# Patient Record
Sex: Female | Born: 1965
Health system: Southern US, Community
[De-identification: ages and names within clinical notes are randomized; demographics above are authoritative.]

## PROBLEM LIST (undated history)

## (undated) DIAGNOSIS — M1990 Unspecified osteoarthritis, unspecified site: Secondary | ICD-10-CM

## (undated) DIAGNOSIS — Z9289 Personal history of other medical treatment: Secondary | ICD-10-CM

## (undated) DIAGNOSIS — D649 Anemia, unspecified: Secondary | ICD-10-CM

## (undated) DIAGNOSIS — T7840XA Allergy, unspecified, initial encounter: Secondary | ICD-10-CM

## (undated) DIAGNOSIS — E039 Hypothyroidism, unspecified: Secondary | ICD-10-CM

## (undated) DIAGNOSIS — R51 Headache: Secondary | ICD-10-CM

## (undated) DIAGNOSIS — E079 Disorder of thyroid, unspecified: Secondary | ICD-10-CM

## (undated) HISTORY — PX: JOINT REPLACEMENT: SHX530

## (undated) HISTORY — PX: TUBAL LIGATION: SHX77

## (undated) HISTORY — DX: Disorder of thyroid, unspecified: E07.9

## (undated) HISTORY — DX: Allergy, unspecified, initial encounter: T78.40XA

## (undated) HISTORY — PX: ENDOMETRIAL ABLATION: SHX621

---

## 1998-02-24 ENCOUNTER — Other Ambulatory Visit: Admission: RE | Admit: 1998-02-24 | Discharge: 1998-02-24 | Payer: Self-pay | Admitting: Obstetrics and Gynecology

## 1999-02-03 ENCOUNTER — Emergency Department (HOSPITAL_COMMUNITY): Admission: EM | Admit: 1999-02-03 | Discharge: 1999-02-03 | Payer: Self-pay | Admitting: *Deleted

## 1999-11-04 ENCOUNTER — Emergency Department (HOSPITAL_COMMUNITY): Admission: EM | Admit: 1999-11-04 | Discharge: 1999-11-04 | Payer: Self-pay | Admitting: Emergency Medicine

## 1999-11-04 ENCOUNTER — Encounter: Payer: Self-pay | Admitting: Emergency Medicine

## 2000-06-13 ENCOUNTER — Emergency Department (HOSPITAL_COMMUNITY): Admission: EM | Admit: 2000-06-13 | Discharge: 2000-06-14 | Payer: Self-pay | Admitting: *Deleted

## 2000-07-13 ENCOUNTER — Emergency Department (HOSPITAL_COMMUNITY): Admission: EM | Admit: 2000-07-13 | Discharge: 2000-07-13 | Payer: Self-pay | Admitting: Emergency Medicine

## 2000-08-10 ENCOUNTER — Ambulatory Visit (HOSPITAL_COMMUNITY): Admission: RE | Admit: 2000-08-10 | Discharge: 2000-08-10 | Payer: Self-pay | Admitting: Family Medicine

## 2000-08-10 ENCOUNTER — Encounter: Payer: Self-pay | Admitting: Family Medicine

## 2000-10-23 ENCOUNTER — Emergency Department (HOSPITAL_COMMUNITY): Admission: EM | Admit: 2000-10-23 | Discharge: 2000-10-23 | Payer: Self-pay | Admitting: *Deleted

## 2001-02-26 ENCOUNTER — Emergency Department (HOSPITAL_COMMUNITY): Admission: EM | Admit: 2001-02-26 | Discharge: 2001-02-26 | Payer: Self-pay | Admitting: *Deleted

## 2001-10-04 ENCOUNTER — Ambulatory Visit (HOSPITAL_COMMUNITY): Admission: RE | Admit: 2001-10-04 | Discharge: 2001-10-04 | Payer: Self-pay | Admitting: Family Medicine

## 2001-10-04 ENCOUNTER — Encounter: Payer: Self-pay | Admitting: Family Medicine

## 2001-10-09 ENCOUNTER — Emergency Department (HOSPITAL_COMMUNITY): Admission: EM | Admit: 2001-10-09 | Discharge: 2001-10-09 | Payer: Self-pay | Admitting: Emergency Medicine

## 2002-01-22 ENCOUNTER — Emergency Department (HOSPITAL_COMMUNITY): Admission: EM | Admit: 2002-01-22 | Discharge: 2002-01-22 | Payer: Self-pay | Admitting: Emergency Medicine

## 2002-02-07 ENCOUNTER — Emergency Department (HOSPITAL_COMMUNITY): Admission: EM | Admit: 2002-02-07 | Discharge: 2002-02-07 | Payer: Self-pay | Admitting: *Deleted

## 2002-10-17 ENCOUNTER — Encounter: Admission: RE | Admit: 2002-10-17 | Discharge: 2002-10-17 | Payer: Self-pay | Admitting: Family Medicine

## 2002-10-17 ENCOUNTER — Encounter: Payer: Self-pay | Admitting: Family Medicine

## 2002-10-21 ENCOUNTER — Encounter: Payer: Self-pay | Admitting: Internal Medicine

## 2002-10-21 ENCOUNTER — Ambulatory Visit (HOSPITAL_COMMUNITY): Admission: RE | Admit: 2002-10-21 | Discharge: 2002-10-21 | Payer: Self-pay | Admitting: Internal Medicine

## 2002-11-07 ENCOUNTER — Encounter: Payer: Self-pay | Admitting: Internal Medicine

## 2002-11-07 ENCOUNTER — Ambulatory Visit (HOSPITAL_COMMUNITY): Admission: RE | Admit: 2002-11-07 | Discharge: 2002-11-07 | Payer: Self-pay | Admitting: Internal Medicine

## 2002-11-19 ENCOUNTER — Emergency Department (HOSPITAL_COMMUNITY): Admission: EM | Admit: 2002-11-19 | Discharge: 2002-11-19 | Payer: Self-pay | Admitting: Emergency Medicine

## 2003-01-03 ENCOUNTER — Emergency Department (HOSPITAL_COMMUNITY): Admission: EM | Admit: 2003-01-03 | Discharge: 2003-01-03 | Payer: Self-pay | Admitting: Emergency Medicine

## 2003-11-18 ENCOUNTER — Ambulatory Visit (HOSPITAL_COMMUNITY): Admission: RE | Admit: 2003-11-18 | Discharge: 2003-11-18 | Payer: Self-pay | Admitting: Family Medicine

## 2004-01-16 ENCOUNTER — Ambulatory Visit (HOSPITAL_COMMUNITY): Admission: RE | Admit: 2004-01-16 | Discharge: 2004-01-16 | Payer: Self-pay | Admitting: Family Medicine

## 2004-02-09 ENCOUNTER — Emergency Department (HOSPITAL_COMMUNITY): Admission: EM | Admit: 2004-02-09 | Discharge: 2004-02-09 | Payer: Self-pay | Admitting: Emergency Medicine

## 2004-06-27 DIAGNOSIS — D649 Anemia, unspecified: Secondary | ICD-10-CM

## 2004-06-27 HISTORY — DX: Anemia, unspecified: D64.9

## 2004-06-29 ENCOUNTER — Emergency Department (HOSPITAL_COMMUNITY): Admission: EM | Admit: 2004-06-29 | Discharge: 2004-06-29 | Payer: Self-pay | Admitting: Emergency Medicine

## 2004-11-18 ENCOUNTER — Encounter: Admission: RE | Admit: 2004-11-18 | Discharge: 2004-11-18 | Payer: Self-pay | Admitting: Family Medicine

## 2005-02-07 ENCOUNTER — Encounter: Admission: RE | Admit: 2005-02-07 | Discharge: 2005-02-07 | Payer: Self-pay | Admitting: Family Medicine

## 2005-04-24 ENCOUNTER — Encounter: Admission: RE | Admit: 2005-04-24 | Discharge: 2005-04-24 | Payer: Self-pay | Admitting: Neurology

## 2005-05-12 ENCOUNTER — Inpatient Hospital Stay (HOSPITAL_COMMUNITY): Admission: RE | Admit: 2005-05-12 | Discharge: 2005-05-17 | Payer: Self-pay | Admitting: Orthopedic Surgery

## 2006-03-30 ENCOUNTER — Ambulatory Visit (HOSPITAL_COMMUNITY): Admission: RE | Admit: 2006-03-30 | Discharge: 2006-03-30 | Payer: Self-pay | Admitting: *Deleted

## 2007-11-15 ENCOUNTER — Emergency Department (HOSPITAL_COMMUNITY): Admission: EM | Admit: 2007-11-15 | Discharge: 2007-11-15 | Payer: Self-pay | Admitting: Emergency Medicine

## 2008-04-08 ENCOUNTER — Encounter: Admission: RE | Admit: 2008-04-08 | Discharge: 2008-04-08 | Payer: Self-pay | Admitting: Family Medicine

## 2009-04-28 ENCOUNTER — Encounter: Admission: RE | Admit: 2009-04-28 | Discharge: 2009-04-28 | Payer: Self-pay | Admitting: Family Medicine

## 2010-04-29 ENCOUNTER — Encounter: Admission: RE | Admit: 2010-04-29 | Discharge: 2010-04-29 | Payer: Self-pay | Admitting: Family Medicine

## 2010-11-12 NOTE — Op Note (Signed)
NAMEBRITTAINY, BUCKER              ACCOUNT NO.:  [id number]   MEDICAL RECORD NO.:  [id number]          PATIENT TYPE:  INP   LOCATION:  0010                         FACILITY:  [facility]   PHYSICIAN:  Georges Lynch. Gioffre, M.D.DATE OF BIRTH:  [birth year]   DATE OF PROCEDURE:  05/12/2005  DATE OF DISCHARGE:                                 OPERATIVE REPORT   SURGEON:  Dr. Darrelyn Hillock   ASSISTANT:  Dr. Durene Romans.   PREOPERATIVE DIAGNOSIS:  Severe degenerative arthritis, left hip.   POSTOPERATIVE DIAGNOSIS:  Severe degenerative arthritis, left hip.   OPERATION:  Left total hip arthroplasty utilizing the DePuy system.  The  sizes used:  1.  First of all, we used a 52 mm pinnacle cuff with 1 screw.  2.  We used a pinnacle metal insert measuring 36 mm in diameter by 52 mm.  3.  The femoral head, metal on metal, was a +5.  We used the standard      femoral stem which was a size 3 standard offset.   PROCEDURE:  Under general anesthesia, a routine orthopedic prep and draping  of the left hip was carried out.  The patient was on the right side with the  left side up.  The patient had 1 gram of IV Ancef.  Following this, an  incision was made over the posterolateral aspect of the left hip, bleeders  identified and cauterized.  At this particular point, we then identified the  iliotibial band, incised that, and great care was taken not to injure the  sciatic nerve.  We advanced our self-retaining retractors.  I went down and  identified the capsule and the external rotators, and I partially detached  the external rotators and did a capsulectomy, dislocated the head, amputated  the head at the appropriate neck length.  Following this, I used my  trochanteric reamers.  I then went down and rasped the femur out to a size  3.  I then used a calcar reamer to even out the femoral neck.  We then  reamed our cup up for a size 52 mm pinnacle cup.  The permanent cup then was  inserted with 1 screw.  We had  excellent fixation.  We also utilized our  center screw which was actually the hole eliminator.  Once the cuff was  inserted, we then inserted our final metal pinnacle cup insert which was 36  x 52 mm.  We then went through trials and selected a +5 metal-on-metal  femoral head.  The trial stem was removed.  We inserted our permanent size 3  femoral component and then snapped on our size +5 36 mm ball.  We irrigated  the acetabulum out and made sure there are no loose fragments of bone or  soft tissue in the acetabulum and reduced the hip.  The hip was extremely  stable in all ranges of motion.  We checked our leg length multiple times.  Our leg length was equal.  Thoroughly irrigated out the area and  reapproximated the soft tissue structures in the usual fashion.  Skin was  closed  metal staples, and a sterile Neosporin dressing was applied.  The  patient left the operating room in satisfactory condition.           ______________________________  Georges Lynch Darrelyn Hillock, M.D.     RAG/MEDQ  D:  05/12/2005  T:  05/12/2005  Job:  [id number]

## 2010-11-12 NOTE — Op Note (Signed)
NAMEDOREATHER, HOXWORTH              ACCOUNT NO.:  [id number]   MEDICAL RECORD NO.:  [id number]          PATIENT TYPE:  AMB   LOCATION:  SDC                           FACILITY:  [facility]   PHYSICIAN:  Dunbar B. Earlene Plater, M.D.  DATE OF BIRTH:  [birth year]   DATE OF PROCEDURE:  03/30/2006  DATE OF DISCHARGE:                                 OPERATIVE REPORT   PREOPERATIVE DIAGNOSIS:  Abnormal bleeding.   POSTOPERATIVE DIAGNOSIS:  Abnormal bleeding.   PROCEDURE:  Hysteroscopy and NovaSure endometrial ablation.   SURGEON:  Chester Holstein. Earlene Plater, M.D.   ASSISTANT:  None.   ANESTHESIA:  LMA general and 20 mL Nesacaine 1% paracervical block.   SPECIMENS:  None.   BLOOD LOSS:  Minimal.   FLUID DEFICIT:  20 mL.   COMPLICATIONS:  None.   INDICATIONS:  Patient with a history of heavy menstrual bleeding, desires  permanent but minimally invasive treatment for her heavy menstrual bleeding.  The patient was advised of the risks of surgery, including infection,  bleeding, uterine perforation, damage to surrounding organs.   PROCEDURE:  The patient was taken to the operating room and LMA general  anesthesia obtained.  She was prepped and draped in standard fashion,  bladder emptied with an in-and-out catheterization.  Examination showed an  anteverted, slightly enlarged uterus, no adnexal masses.   Speculum inserted, paracervical block placed, single-tooth attached.  Cervical length and uterine cavity depth estimated and recorded.  Cervix  dilated to #17 and the diagnostic hysteroscope inserted after being flushed  with uterine distention.  The cavity was inspected and no focal  abnormalities were seen.  The scope removed, NovaSure device inserted, and  the array deployed in standard fashion.  CO2 test performed and passed.  Endometrial ablation performed for 2 minutes.   The array was retracted, NovaSure removed, the scope reinserted, and good  endometrial coverage was noted.   The instruments were  removed, cervix hemostatic.  The patient tolerated the  procedure well with no complications.  She was taken to the recovery room  awake, alert, in stable condition.      Gerri Spore B. Earlene Plater, M.D.  Electronically Signed     WBD/MEDQ  D:  03/30/2006  T:  03/31/2006  Job:  [id number]

## 2010-11-12 NOTE — H&P (Signed)
[name], [name]              ACCOUNT NO.:  [id number]   MEDICAL RECORD NO.:  [id number]           PATIENT TYPE:   LOCATION:                                 FACILITY:   PHYSICIAN:  Georges Lynch. Gioffre, M.D.DATE OF BIRTH:  Sep 12, 1965   DATE OF ADMISSION:  05/12/2005  DATE OF DISCHARGE:                                HISTORY & PHYSICAL   CHIEF COMPLAINT:  Left hip pain and loss of range of motion.   HISTORY OF PRESENT ILLNESS:  The patient is a 45 year old black female who  came in this last week previous for evaluation of right knee pain, but she  also complained of right hip pain.  She has been having pain in that hip  over the last year.  She did have it evaluated previously by primary care  physician for loss of range of motion with an MRI which showed  osteoarthritis.  The patient was having groin pain and loss of range of  motion in that hip on the office visit.  The pain is a sharp stabbing type  pain over the anterolateral aspect of the hip.  She is no longer able to  lift and rotate her leg to the point where she is able to put her own shoes  and socks on. She has pain whenever she walks.  Does have some grating  sensation.  X-rays show severe osteoarthritis of the left hip with large  osteophyte formation and loss of joint space.   ALLERGIES:  No known drug allergies.   CURRENT MEDICATIONS:  Synthroid 150 mcg daily.   PAST MEDICAL HISTORY:  Includes hypothyroidism.   PAST SURGICAL HISTORY:  C section x2 with no complications.   SOCIAL HISTORY:  The patient is married.  She works as a Scientist, physiological. She  has 2 grown children.  She does not smoke or use alcohol.  She lives in a on-  story house.   Primary care physician is Dr. Jocelyn Lamer D. Reese.   FAMILY MEDICAL HISTORY:  Father is deceased with complications related to  cirrhosis of the liver and related cancer, also diabetes at the age of 31.  Mother is 89 with diabetes and arthritis.   REVIEW OF SYSTEMS:  Positive  for occasional migraines for which she uses  over-the-counter Excedrin Migraine.  Otherwise, all Review of Systems  categories are unremarkable.   PHYSICAL EXAMINATION:  VITAL SIGNS: Height 5 feet 5 inches, weigh 190  pounds. Temperature 98.3, blood pressure 112/78, pulse 80, respirations 12.  GENERAL:  This is a healthy-appearing, well-developed, 45 year old black  female. She does walk with a left-sided limp.  She easily gets on and off  the exam table. She appears to be in no extreme distress.  HEENT:  Head is normocephalic. Pupils equal, round, and reactive.  Extraocular movements intact  Gross hearing is intact.  Oral buccal mucosa  is pink and moist.  NECK: Supple.  No palpable lymphadenopathy.  Thyroid region was nontender.  She had good range of motion of her cervical spine without any difficulty or  tenderness.  CHEST: Lung sounds were clear and  equal bilaterally. No wheezes, rales,  rhonchi.  HEART: Regular rate and rhythm. No murmurs, rubs, or gallops.  ABDOMEN:  Soft, nontender.  Bowel sounds normoactive. No CVA region  tenderness.  UPPER EXTREMITIES:  Symmetric size and shape.  She had good range of motion  of shoulders, elbows, wrists.  Motor strength 5/5.  LOWER EXTREMITIES: Right hip had full extension and flexion up to 130  degrees with 20 degrees internal and external rotation without any  discomfort.  Left hip had full extension and flexion up to about 80 degrees.  She had virtually no internal or external rotation without any discomfort or  mechanical blocking.  Bilateral knees were symmetrical. She had full  extension, flexion back to about 110 degrees with no instability.  Calves  were nontender.  The ankles were symmetrical with good dorsiflexion and  plantar flexion.  PERIPHERAL VASCULAR:  Carotid pulses were 2+ with no bruits.  Radial pulses  were 2+.  Dorsalis pedis and posterior tibial pulses were 1+.  She had some  slight lower extremity edema but no venous  stasis changes.  NEUROLOGIC: The patient was conscious, alert, and appropriate, held easy  conversation with the examiner.  Cranial nerves II-XII were grossly intact.  She had no gross neurologic defects noted.  BREASTS, RECTAL, AND GU: Exams deferred at this time.   IMPRESSION:  1.  End-stage osteoarthritis left hip.  2.  Occasional migraines.  3.  Hypothyroidism.   PLAN:  The patient will undergo all routine labs and tests prior to having a  left total hip arthroplasty by Dr. Darrelyn Hillock at [facility] on  May 12, 2005.      Jamelle Rushing, P.A.    ______________________________  Georges Lynch Darrelyn Hillock, M.D.    RWK/MEDQ  D:  04/27/2005  T:  04/27/2005  Job:  [id number]

## 2010-11-12 NOTE — Discharge Summary (Signed)
NAMEJOLLY, CARLINI              ACCOUNT NO.:  [id number]   MEDICAL RECORD NO.:  [id number]          PATIENT TYPE:  INP   LOCATION:  1508                         FACILITY:  [facility]   PHYSICIAN:  Georges Lynch. Gioffre, M.D.DATE OF BIRTH:  [birth year]   DATE OF ADMISSION:  05/12/2005  DATE OF DISCHARGE:  05/17/2005                                 DISCHARGE SUMMARY   ADMISSION DIAGNOSES:  1.  End-stage osteoarthritis, left hip.  2.  Occasional migraines.  3.  Hypothyroidism.   DISCHARGE DIAGNOSES:  1.  Left total hip arthroplasty.  2.  Postoperative blood loss anemia tolerated without transfusion.  3.  History of occasional migraines.  4.  History of hypothyroidism.   HISTORY OF PRESENT ILLNESS:  The patient is a 45 year old black female who  presented the week previous for evaluation of right knee pain ____________  evaluation. She had severe end-stage osteoarthritis of the right hip. It has  progressively worsened over the last year. The pain was deep within the  groin. She had noted significant loss of range of motion. She describes the  pain as sharp and stabbing type pain with any intensive range of motion or  weightbearing.   X-rays show severe osteoarthritis of the left hip with large osteophytes and  complete loss of joint space.   ALLERGIES:  No drug allergies.   CURRENT MEDICATIONS:  Synthroid 150 mg daily._______   PROCEDURE:  On May 12, 2005, the patient was taken to the OR by Dr.  Ranee Gosselin, assisted by Dr. Royetta Car. Under general anesthesia, the  patient underwent a left total hip arthroplasty with metal on metal. The  following components were implanted. A size 3 tapered stem, a size 36 plus 5  metal on metal femoral head, a size __________ neutral 36 mm inside  diameter, 52 mm outside diameter insert. A size 52 mm outside diameter  acetabular cup ___________ apex hole eliminator and one cancellous screw  6.5, 30 mm length. The patient tolerated the  procedure well without any  complications and transferred to the recovery room and then to the  orthopedic floor in good condition.   CONSULTATIONS:  The following routine consults were requested:  Physical  therapy, case management, pharmacy for dosing.   HOSPITAL COURSE:  On May 12, 2005, the patient was admitted to [facility] under the care of Dr. Mickel Baas. The patient was taken to  the OR where a left total hip arthroplasty was performed. The patient  tolerated transitioning from IV pain and antibiotics to p.o. pain  medications well without any issues. She ___________ 5 days postoperative  care on the orthopedic floor following routine protocols. She was started on  Coumadin and heparin for DVT prophylaxis and was managed by pharmacy  throughout her stay. The patient's wound remained ___________ the leg  remained neuromotor vascularly intact. The patient did develop some  postoperative blood loss anemia. initially her hemoglobin dropped into the  mid 8 levels but after introducing IV hydration she improved to 9.4. She was  also typed, crossed and transfused 2 units of packed red blood  cells without  any untoward events. The patient otherwise remained stable on all other  issues. She worked well with physical therapy on a daily basis. She was felt  on postoperative day #5 to be orthopedically and medically stable and ready  for transfer home so she was discharged home with outpatient home health  physical therapy arranged and she was discharged in good condition.   LABORATORY DATA:  CBC, November 19 WBC is 13, __________ with no obvious  source of infection, felt to be postoperative stress. Hemoglobin 9.4 up from  8.0 the day previous. After 2 units of packed red blood cells __________,  platelets 280. INR on November 21 was 2.7, was very labile. Routine  chemistries November 19, sodium 134, potassium 3.3, glucose 107, BUN 4,  creatinine 0.9. The patient  received a total of 2 units of packed red blood  cells during the hospitalization.   DISCHARGE MEDICATIONS:  1.  Synthroid 125 mcg p.o. daily.  2.  Colace 100 mg twice a day.  3.  Ferrous sulfate 325 mg 3 times a day.  4.  Dulcolax 10 mg p.o. q.h.s. p.r.n.  5.  Phenergan 25 mg p.o. q.6h. p.r.n.  6.  Percocet 1 or 2 tablets every q.4-6h. p.r.n. pain.  7.  Robaxin 500 mg p.o. every q.6h. p.r.n.   ___________ not tolerating Percocet well, she may try Vicodin 5 mg 1 or 2  tablets every 4-6 h p.r.n. pain.   DISCHARGE INSTRUCTIONS:  1.  Diet - no restrictions.  2.  Activity- the patient will be weightbearing as tolerated with the use of      crutches or walker.  3.  Wound care - the patient should change dressing daily ___________.  4.  Medications - the patient should resume her routine meds with the      addition of:  5.  Percocet 1 or 2 tablets every 4-6 h for pain as needed.  6.  Coumadin 5 mg a day or as instructed by the pharmacist.  7.  Robaxin 500 mg every 6 h for muscle spasms as needed.   FOLLOW UP:  The patient is to followup with Dr. Darrelyn Hillock 2 weeks from the day  of surgery. The patient is to call for an appointment.   CONDITION ON DISCHARGE:  Improved, good.      Jamelle Rushing, P.A.    ______________________________  Georges Lynch Darrelyn Hillock, M.D.    RWK/MEDQ  D:  06/09/2005  T:  06/10/2005  Job:  [id number]

## 2011-04-28 ENCOUNTER — Other Ambulatory Visit: Payer: Self-pay | Admitting: Family Medicine

## 2011-04-28 DIAGNOSIS — Z1231 Encounter for screening mammogram for malignant neoplasm of breast: Secondary | ICD-10-CM

## 2011-05-04 ENCOUNTER — Ambulatory Visit
Admission: RE | Admit: 2011-05-04 | Discharge: 2011-05-04 | Disposition: A | Discharge status: Home / Self Care | Payer: 59 | Source: Ambulatory Visit | Attending: Family Medicine | Admitting: Family Medicine

## 2011-05-04 DIAGNOSIS — Z1231 Encounter for screening mammogram for malignant neoplasm of breast: Secondary | ICD-10-CM

## 2011-08-03 ENCOUNTER — Ambulatory Visit (INDEPENDENT_AMBULATORY_CARE_PROVIDER_SITE_OTHER): Payer: 59 | Admitting: Family Medicine

## 2011-08-03 VITALS — BP 111/74 | HR 78 | Temp 98.7°F | Resp 18 | Ht 66.0 in | Wt 206.0 lb

## 2011-08-03 DIAGNOSIS — E039 Hypothyroidism, unspecified: Secondary | ICD-10-CM | POA: Insufficient documentation

## 2011-08-03 DIAGNOSIS — R059 Cough, unspecified: Secondary | ICD-10-CM

## 2011-08-03 DIAGNOSIS — B9789 Other viral agents as the cause of diseases classified elsewhere: Secondary | ICD-10-CM

## 2011-08-03 DIAGNOSIS — R05 Cough: Secondary | ICD-10-CM

## 2011-08-03 DIAGNOSIS — J069 Acute upper respiratory infection, unspecified: Secondary | ICD-10-CM

## 2011-08-03 MED ORDER — BENZONATATE 100 MG PO CAPS: 100.0000 mg | ORAL_CAPSULE | Freq: Three times a day (TID) | ORAL | Status: AC | PRN

## 2011-08-03 NOTE — Patient Instructions (Signed)
Patient was told to stay off of work today. She can probably return tomorrow. She is to drink plenty of fluids and get enough rest. She can take for cough tablets. If she needs something in addition to them for the cough she is recommended to try some over-the-counter Delsym.  For the drainage from her nose and pressure in the sinuses she is to take either Claritin-D or Allegra D., both of which come in generic.  If symptoms persist for 5-7 days she is to let me know.

## 2011-08-03 NOTE — Progress Notes (Signed)
  Subjective:    Patient ID: [name], female    DOB: 04/05/1966, 46 y.o.   MRN: [id number]  HPI Patient awake awakened yesterday with a sore throat. She had a sore throat and then later in the day of life and especially this morning has had a lot of head congestion. Still having a sore throat though not as bad. She has pressure in her sinuses. She has not had any defined fever. It has gone into a cough some also. She attributes some of her symptoms to the postnasal drainage. Her coworkers at work her for many been ill also   Review of Systems no other major complaints.     Objective:   Physical Exam Afro-American female, looks like she does not feel well today. TMs normal tenderness over the maxillary sinuses. Throat does not appear particularly erythematous, in fact is a little pale and boggy looking. Neck supple without any significant nodes chest was clear to auscultation heart regular without murmur       Assessment & Plan:  Viral syndrome with pharyngitis, sinus congestion, and secondary cough.  Will treat symptomatically. If it persists for 5-7 days she is to let me know if she is not doing better thank you

## 2011-08-05 ENCOUNTER — Telehealth: Payer: Self-pay

## 2011-08-05 NOTE — Telephone Encounter (Signed)
.  [facility] PT WAS TO CALL BACK IF NO BETTER AND SHE ISN'T PLEASE CALL [phone]

## 2011-08-06 NOTE — Telephone Encounter (Signed)
Spoke with patient, she is still very congested, clear mucus, no fever, sinus pressure.  Would like to know next step?   She is taking the zyrtec 24 hr.  No improvement.

## 2011-08-07 ENCOUNTER — Telehealth: Payer: Self-pay

## 2011-08-07 MED ORDER — IPRATROPIUM BROMIDE 0.03 % NA SOLN: 2.0000 | Freq: Two times a day (BID) | NASAL | Status: DC

## 2011-08-07 NOTE — Telephone Encounter (Signed)
LMOM FOR PT TO C/B.

## 2011-08-07 NOTE — Telephone Encounter (Signed)
ADVISED PT OF NOTES FROM PROVIDER

## 2011-08-07 NOTE — Telephone Encounter (Signed)
Please advise patient that I've sent in a prescription for Atrovent nasal spray to help open up her sinuses and allow for better drainage.  Continue with rest, fluids, and Mucinex.

## 2011-08-07 NOTE — Telephone Encounter (Signed)
Patient is returning clinical team leader call from this morning.

## 2011-11-12 ENCOUNTER — Ambulatory Visit (INDEPENDENT_AMBULATORY_CARE_PROVIDER_SITE_OTHER): Payer: 59 | Admitting: Internal Medicine

## 2011-11-12 VITALS — BP 104/70 | HR 73 | Temp 98.4°F | Resp 16 | Ht 66.0 in | Wt 210.4 lb

## 2011-11-12 DIAGNOSIS — S239XXA Sprain of unspecified parts of thorax, initial encounter: Secondary | ICD-10-CM

## 2011-11-12 DIAGNOSIS — IMO0002 Reserved for concepts with insufficient information to code with codable children: Secondary | ICD-10-CM

## 2011-11-12 MED ORDER — MELOXICAM 15 MG PO TABS: 15.0000 mg | ORAL_TABLET | Freq: Every day | ORAL | Status: DC

## 2011-11-12 MED ORDER — CYCLOBENZAPRINE HCL 10 MG PO TABS: 10.0000 mg | ORAL_TABLET | Freq: Every day | ORAL | Status: AC
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
## 2012-04-04 ENCOUNTER — Encounter: Payer: Self-pay | Admitting: Cardiology

## 2012-04-12 ENCOUNTER — Other Ambulatory Visit: Payer: Self-pay | Admitting: Family Medicine

## 2012-04-12 DIAGNOSIS — Z1231 Encounter for screening mammogram for malignant neoplasm of breast: Secondary | ICD-10-CM

## 2012-05-07 ENCOUNTER — Ambulatory Visit: Payer: 59

## 2012-05-18 ENCOUNTER — Ambulatory Visit
Admission: RE | Admit: 2012-05-18 | Discharge: 2012-05-18 | Disposition: A | Discharge status: Home / Self Care | Payer: 59 | Source: Ambulatory Visit | Attending: Family Medicine | Admitting: Family Medicine

## 2012-05-18 DIAGNOSIS — Z1231 Encounter for screening mammogram for malignant neoplasm of breast: Secondary | ICD-10-CM

## 2012-07-10 ENCOUNTER — Ambulatory Visit (INDEPENDENT_AMBULATORY_CARE_PROVIDER_SITE_OTHER): Payer: 59 | Admitting: Physician Assistant

## 2012-07-10 VITALS — BP 104/71 | HR 84 | Temp 97.9°F | Resp 16 | Ht 66.5 in | Wt 224.0 lb

## 2012-07-10 DIAGNOSIS — J069 Acute upper respiratory infection, unspecified: Secondary | ICD-10-CM

## 2012-07-10 MED ORDER — IPRATROPIUM BROMIDE 0.03 % NA SOLN: 2.0000 | Freq: Two times a day (BID) | NASAL | Status: DC

## 2012-07-10 MED ORDER — BENZONATATE 100 MG PO CAPS: 100.0000 mg | ORAL_CAPSULE | Freq: Three times a day (TID) | ORAL | Status: DC | PRN

## 2012-07-10 NOTE — Progress Notes (Signed)
  Subjective:    Patient ID: [name], female    DOB: 09-22-1965, 47 y.o.   MRN: [id number]  HPI   [name] is a pleasant 47 yr old female here with URI symptoms for the last 4-5 days.  Began with cough, then progressed to sore throat, sinus pressure, runny nose.  Yellow nasal drainage.  Cough productive of yellow sputum.  No ear pain.  +post-nasal drainage.  Cough worse at night.  No fever or chills.  No GI symptoms.  No body aches.  States her 3 foster children have been sick over the last couple weeks.     Review of Systems  Constitutional: Negative for fever and chills.  HENT: Positive for congestion, sore throat, rhinorrhea, postnasal drip and sinus pressure. Negative for ear pain.   Respiratory: Positive for cough. Negative for shortness of breath and wheezing.   Cardiovascular: Negative.   Gastrointestinal: Negative.   Musculoskeletal: Negative.   Skin: Negative.   Neurological: Negative for headaches.       Objective:   Physical Exam  Vitals reviewed. Constitutional: She is oriented to person, place, and time. She appears well-developed and well-nourished. No distress.  HENT:  Head: Normocephalic and atraumatic.  Right Ear: Tympanic membrane and ear canal normal.  Left Ear: Tympanic membrane and ear canal normal.  Nose: Right sinus exhibits maxillary sinus tenderness. Right sinus exhibits no frontal sinus tenderness. Left sinus exhibits maxillary sinus tenderness (L > R). Left sinus exhibits no frontal sinus tenderness.  Mouth/Throat: Uvula is midline, oropharynx is clear and moist and mucous membranes are normal.       Post-nasal drainage  Eyes: Conjunctivae normal are normal. No scleral icterus.  Neck: Neck supple.  Cardiovascular: Normal rate, regular rhythm, normal heart sounds and intact distal pulses.  Exam reveals no gallop and no friction rub.   No murmur heard. Pulmonary/Chest: Effort normal and breath sounds normal. She has no wheezes. She has no rales.    Lymphadenopathy:    She has no cervical adenopathy.  Neurological: She is alert and oriented to person, place, and time.  Skin: Skin is warm and dry.  Psychiatric: She has a normal mood and affect. Her behavior is normal.     Filed Vitals:   07/10/12 0832  BP: 104/71  Pulse: 84  Temp: 97.9 F (36.6 C)  Resp: 16        Assessment & Plan:   1. Viral URI with cough  ipratropium (ATROVENT) 0.03 % nasal spray, benzonatate (TESSALON) 100 MG capsule    [name] is a very pleasant 47 yr old female here with URI symptoms, suspect viral etiology.  She is afebrile, well-appearing, and lungs and throat are clear.  Maxillary sinus tenderness present on exam (L>R) but given that symptoms have been present only 4-5 days, will hold off on antibiotic treatment at this point.  Will treat symptoms with Atrovent and Tessalon.  Encouraged Robitussin or Delsym for additional cough relief given pt intolerance to codeine/oxycodone.  Also encouraged OTC antihistamine for symptom relief.  Push fluids, plenty of rest. Pt will let us know if worsening or not improving.    If in 4-5 days pt continues to have facial pain/pressure, will treat as sinusitis with amox/clav x 10 days.

## 2012-07-10 NOTE — Patient Instructions (Addendum)
Begin using Atrovent nasal spray twice daily for relief of congestion and post-nasal drainage.  Also begin a daily allergy medication (Claritin/Allegra/Zyrtec) for further relief of symptoms.  You may continue Mucinex as well.  Tessalon for cough up to three times per day.  If you need stronger cough relief at night, I would try Robitussin or Delsym.  Plenty of fluids (water is best!) and rest!  Good hand hygiene.  Cover your mouth when coughing or sneezing.  Please let us know if you are worsening or not improving.  If you continue to have facial pain/pressure over the next 4-5 days, let us know and we can call in an antibiotic.   Upper Respiratory Infection, Adult An upper respiratory infection (URI) is also sometimes known as the common cold. The upper respiratory tract includes the nose, sinuses, throat, trachea, and bronchi. Bronchi are the airways leading to the lungs. Most people improve within 1 week, but symptoms can last up to 2 weeks. A residual cough may last even longer.  CAUSES Many different viruses can infect the tissues lining the upper respiratory tract. The tissues become irritated and inflamed and often become very moist. Mucus production is also common. A cold is contagious. You can easily spread the virus to others by oral contact. This includes kissing, sharing a glass, coughing, or sneezing. Touching your mouth or nose and then touching a surface, which is then touched by another person, can also spread the virus. SYMPTOMS  Symptoms typically develop 1 to 3 days after you come in contact with a cold virus. Symptoms vary from person to person. They may include:  Runny nose.  Sneezing.  Nasal congestion.  Sinus irritation.  Sore throat.  Loss of voice (laryngitis).  Cough.  Fatigue.  Muscle aches.  Loss of appetite.  Headache.  Low-grade fever. DIAGNOSIS  You might diagnose your own cold based on familiar symptoms, since most people get a cold 2 to 3 times a  year. Your caregiver can confirm this based on your exam. Most importantly, your caregiver can check that your symptoms are not due to another disease such as strep throat, sinusitis, pneumonia, asthma, or epiglottitis. Blood tests, throat tests, and X-rays are not necessary to diagnose a common cold, but they may sometimes be helpful in excluding other more serious diseases. Your caregiver will decide if any further tests are required. RISKS AND COMPLICATIONS  You may be at risk for a more severe case of the common cold if you smoke cigarettes, have chronic heart disease (such as heart failure) or lung disease (such as asthma), or if you have a weakened immune system. The very young and very old are also at risk for more serious infections. Bacterial sinusitis, middle ear infections, and bacterial pneumonia can complicate the common cold. The common cold can worsen asthma and chronic obstructive pulmonary disease (COPD). Sometimes, these complications can require emergency medical care and may be life-threatening. PREVENTION  The best way to protect against getting a cold is to practice good hygiene. Avoid oral or hand contact with people with cold symptoms. Wash your hands often if contact occurs. There is no clear evidence that vitamin C, vitamin E, echinacea, or exercise reduces the chance of developing a cold. However, it is always recommended to get plenty of rest and practice good nutrition. TREATMENT  Treatment is directed at relieving symptoms. There is no cure. Antibiotics are not effective, because the infection is caused by a virus, not by bacteria. Treatment may include:  Increased fluid intake. Sports drinks offer valuable electrolytes, sugars, and fluids.  Breathing heated mist or steam (vaporizer or shower).  Eating chicken soup or other clear broths, and maintaining good nutrition.  Getting plenty of rest.  Using gargles or lozenges for comfort.  Controlling fevers with ibuprofen  or acetaminophen as directed by your caregiver.  Increasing usage of your inhaler if you have asthma. Zinc gel and zinc lozenges, taken in the first 24 hours of the common cold, can shorten the duration and lessen the severity of symptoms. Pain medicines may help with fever, muscle aches, and throat pain. A variety of non-prescription medicines are available to treat congestion and runny nose. Your caregiver can make recommendations and may suggest nasal or lung inhalers for other symptoms.  HOME CARE INSTRUCTIONS   Only take over-the-counter or prescription medicines for pain, discomfort, or fever as directed by your caregiver.  Use a warm mist humidifier or inhale steam from a shower to increase air moisture. This may keep secretions moist and make it easier to breathe.  Drink enough water and fluids to keep your urine clear or pale yellow.  Rest as needed.  Return to work when your temperature has returned to normal or as your caregiver advises. You may need to stay home longer to avoid infecting others. You can also use a face mask and careful hand washing to prevent spread of the virus. SEEK MEDICAL CARE IF:   After the first few days, you feel you are getting worse rather than better.  You need your caregiver's advice about medicines to control symptoms.  You develop chills, worsening shortness of breath, or brown or red sputum. These may be signs of pneumonia.  You develop yellow or brown nasal discharge or pain in the face, especially when you bend forward. These may be signs of sinusitis.  You develop a fever, swollen neck glands, pain with swallowing, or white areas in the back of your throat. These may be signs of strep throat. SEEK IMMEDIATE MEDICAL CARE IF:   You have a fever.  You develop severe or persistent headache, ear pain, sinus pain, or chest pain.  You develop wheezing, a prolonged cough, cough up blood, or have a change in your usual mucus (if you have chronic  lung disease).  You develop sore muscles or a stiff neck. Document Released: 12/07/2000 Document Revised: 09/05/2011 Document Reviewed: 10/15/2010 [facility] Patient Information 2013 [place], [place].

## 2012-11-05 ENCOUNTER — Other Ambulatory Visit: Payer: 59

## 2012-11-05 ENCOUNTER — Other Ambulatory Visit: Payer: Self-pay | Admitting: Family Medicine

## 2012-11-05 DIAGNOSIS — M25552 Pain in left hip: Secondary | ICD-10-CM

## 2012-11-06 ENCOUNTER — Ambulatory Visit
Admission: RE | Admit: 2012-11-06 | Discharge: 2012-11-06 | Disposition: A | Discharge status: Home / Self Care | Payer: 59 | Source: Ambulatory Visit | Attending: Family Medicine | Admitting: Family Medicine

## 2012-11-06 DIAGNOSIS — M25552 Pain in left hip: Secondary | ICD-10-CM

## 2013-01-31 ENCOUNTER — Other Ambulatory Visit: Payer: Self-pay | Admitting: Orthopedic Surgery

## 2013-01-31 DIAGNOSIS — Z96642 Presence of left artificial hip joint: Secondary | ICD-10-CM

## 2013-02-04 ENCOUNTER — Ambulatory Visit
Admission: RE | Admit: 2013-02-04 | Discharge: 2013-02-04 | Disposition: A | Discharge status: Home / Self Care | Payer: 59 | Source: Ambulatory Visit | Attending: Orthopedic Surgery | Admitting: Orthopedic Surgery

## 2013-02-04 DIAGNOSIS — E039 Hypothyroidism, unspecified: Secondary | ICD-10-CM

## 2013-02-04 DIAGNOSIS — G894 Chronic pain syndrome: Secondary | ICD-10-CM

## 2013-02-04 DIAGNOSIS — Z96642 Presence of left artificial hip joint: Secondary | ICD-10-CM

## 2013-02-04 LAB — SYNOVIAL CELL COUNT + DIFF, W/ CRYSTALS
Crystals, Fluid: NONE SEEN
Eosinophils-Synovial: 0 % (ref 0–1)
Lymphocytes-Synovial Fld: 4 % (ref 0–20)
Monocyte/Macrophage: 5 % — ABNORMAL LOW (ref 50–90)
Neutrophil, Synovial: 91 % — ABNORMAL HIGH (ref 0–25)

## 2013-02-07 LAB — BODY FLUID CULTURE: Organism ID, Bacteria: NO GROWTH

## 2013-02-08 LAB — ANAEROBIC CULTURE

## 2013-02-18 ENCOUNTER — Encounter (HOSPITAL_COMMUNITY): Payer: Self-pay | Admitting: Pharmacy Technician

## 2013-02-19 NOTE — Patient Instructions (Addendum)
20 SHAMBRIA CAMERER  02/19/2013   Your procedure is scheduled on: 03/04/13  Report to [facility] at 11:30 AM.  Call this number if you have problems the morning of surgery [phone]: [phone]   Remember:   Do not eat food After Midnight, clear liquids from midnight until 8:00am on 03/04/13 then nothing.      Take these medicines the morning of surgery with A SIP OF WATER: synthroid   Do not wear jewelry, make-up or nail polish.  Do not wear lotions, powders, or perfumes. You may wear deodorant.  Do not shave 48 hours prior to surgery. Men may shave face and neck.  Do not bring valuables to the hospital.  Contacts, dentures or bridgework may not be worn into surgery.  Leave suitcase in the car. After surgery it may be brought to your room.  For patients admitted to the hospital, checkout time is 11:00 AM the day of discharge.    Please read over the following fact sheets that you were given: MRSA Information, incentive spirometry fact sheet, blood fact sheet, clear liquids fact sheet Birdie Sons, RN  pre op nurse call if needed [phone]    FAILURE TO FOLLOW THESE INSTRUCTIONS MAY RESULT IN CANCELLATION OF YOUR SURGERY   Patient Signature: ___________________________________________  PT NOTIFIED BY PHONE CALL THAT HER SURGERY TIME MOVED UP TO 7:30 AM ON 03/04/13 AND SHE NEEDS TO ARRIVE TO SHORT STAY BY 5:30 AM AND NOTHING TO EAT OR DRINK AFTER MIDNIGHT NIGHT BEFORE SURGERY - EXCEPT SIP OF WATER TO TAKE HER LEVOTHYROXINE.  PT VOICED UNDERSTANDING.

## 2013-02-19 NOTE — Progress Notes (Signed)
Surgery clearance note Dr. Pecola Leisure 02/06/13 on chart

## 2013-02-20 ENCOUNTER — Encounter (HOSPITAL_COMMUNITY)
Admission: RE | Admit: 2013-02-20 | Discharge: 2013-02-20 | Disposition: A | Discharge status: Home / Self Care | Payer: 59 | Source: Ambulatory Visit | Attending: Orthopedic Surgery | Admitting: Orthopedic Surgery

## 2013-02-20 ENCOUNTER — Encounter (HOSPITAL_COMMUNITY): Payer: Self-pay

## 2013-02-20 DIAGNOSIS — Z01812 Encounter for preprocedural laboratory examination: Secondary | ICD-10-CM | POA: Insufficient documentation

## 2013-02-20 HISTORY — DX: Unspecified osteoarthritis, unspecified site: M19.90

## 2013-02-20 HISTORY — DX: Anemia, unspecified: D64.9

## 2013-02-20 HISTORY — DX: Headache: R51

## 2013-02-20 HISTORY — DX: Personal history of other medical treatment: Z92.89

## 2013-02-20 LAB — BASIC METABOLIC PANEL
CO2: 27 mEq/L (ref 19–32)
Calcium: 9.7 mg/dL (ref 8.4–10.5)
Creatinine, Ser: 0.92 mg/dL (ref 0.50–1.10)
Glucose, Bld: 99 mg/dL (ref 70–99)

## 2013-02-20 LAB — CBC
MCH: 28.1 pg (ref 26.0–34.0)
MCV: 88.3 fL (ref 78.0–100.0)
Platelets: 390 10*3/uL (ref 150–400)
RBC: 4.09 MIL/uL (ref 3.87–5.11)
RDW: 13.5 % (ref 11.5–15.5)

## 2013-02-20 LAB — URINALYSIS, ROUTINE W REFLEX MICROSCOPIC
Glucose, UA: NEGATIVE mg/dL
Protein, ur: NEGATIVE mg/dL

## 2013-02-20 LAB — URINE MICROSCOPIC-ADD ON

## 2013-02-25 DIAGNOSIS — Z9289 Personal history of other medical treatment: Secondary | ICD-10-CM

## 2013-02-25 HISTORY — DX: Personal history of other medical treatment: Z92.89

## 2013-02-27 NOTE — H&P (Signed)
TOTAL HIP REVISION ADMISSION H&P  Patient is admitted for left revision total hip arthroplasty vs resection of total hip arthroplasty.  Subjective:  Chief Complaint: Left hip pain S/P total hip arthroplasty  HPI: [name], 47 y.o. female, has a history of pain and functional disability in the left hip due to pain from previous metal-on-metal hip replacement. The indications for the revision total hip arthroplasty are pain from a metal-on-metal hip replacement.  Onset of symptoms was gradual starting 2 months ago with rapidlly worsening course since that time.  Prior procedures on the left hip include arthroplasty.  Patient currently rates pain in the left hip at 10 out of 10 with activity.  There is night pain, worsening of pain with activity and weight bearing, trendelenberg gait, pain that interfers with activities of daily living and pain with passive range of motion. Patient has evidence of previous metal-on-metal total hip arthroplasty by imaging studies.  This condition presents safety issues increasing the risk of falls.  This patient has had previous labs of sed rate: 30 (0-22); CRP 4.6 (< 0.60); Cobalt 51.1 (<1.9); Chromium 16.2 (<= 1.2).  We have discussed at length the possibility that if infection is evident that we would have to resect the left hip and place an antibiotic spacer, and treat with IV antibiotic through a PICC line for 6 weeks. She states that she understands.  Risks, benefits and expectations were discussed with the patient. Patient understand the risks, benefits and expectations and wishes to proceed with surgery.     D/C Plans:   Home with HHPT  Post-op Meds:    Rx given for ASA, Robaxin, Iron, Colace and MiraLax  Tranexamic Acid:   To be given  Decadron:    To be given  FYI:    ASA post-op  Norco (potential N&V) and tramadol, prn   Patient Active Problem List   Diagnosis Date Noted  . Hypothyroid 08/03/2011   Past Medical History  Diagnosis Date  .  Thyroid disease     Hypothyroid on replacement therapy  . Headache(784.0) 6 years ago  . Arthritis   . Anemia     "younger days"  . History of blood transfusion     Past Surgical History  Procedure Laterality Date  . Joint replacement      Left hip replaced in 2006  . Endometrial ablation    . Cesarean section  1990, 1991  . Tubal ligation      No prescriptions prior to admission   Allergies  Allergen Reactions  . Codeine Shortness Of Breath, Nausea And Vomiting and Palpitations  . Shellfish Allergy Swelling  . Oxycodone Itching and Palpitations    History  Substance Use Topics  . Smoking status: Former Smoker    Types: Cigarettes    Quit date: 06/27/2000  . Smokeless tobacco: Never Used     Comment: social smoker  . Alcohol Use: No    Family History  Problem Relation Age of Onset  . Hypertension Mother   . Hypertension Father       Review of Systems  Constitutional: Negative.   HENT: Negative.   Eyes: Negative.   Respiratory: Negative.   Cardiovascular: Negative.   Gastrointestinal: Negative.   Genitourinary: Negative.   Musculoskeletal: Positive for joint pain.  Skin: Negative.   Neurological: Negative.   Endo/Heme/Allergies: Negative.   Psychiatric/Behavioral: Negative.     Objective:  Physical Exam  Constitutional: She is oriented to person, place, and time. She appears well-developed  and well-nourished.  HENT:  Head: Normocephalic and atraumatic.  Mouth/Throat: Oropharynx is clear and moist.  Eyes: Pupils are equal, round, and reactive to light.  Neck: Neck supple. No JVD present. No tracheal deviation present. No thyromegaly present.  Cardiovascular: Normal rate, regular rhythm, normal heart sounds and intact distal pulses.   Respiratory: Effort normal and breath sounds normal. No stridor. No respiratory distress. She has no wheezes.  GI: Soft. There is no tenderness. There is no guarding.  Musculoskeletal:       Left hip: She exhibits  decreased range of motion, decreased strength, tenderness, bony tenderness and laceration (healed from previous surgery).  Lymphadenopathy:    She has no cervical adenopathy.  Neurological: She is alert and oriented to person, place, and time.  Skin: Skin is warm and dry.  Psychiatric: She has a normal mood and affect.     Labs:  Estimated body mass index is 35.62 kg/(m^2) as calculated from the following:   Height as of 07/10/12: 5' 6.5" (1.689 m).   Weight as of 07/10/12: 101.606 kg (224 lb).  Imaging Review:  Plain radiographs demonstrate previous metal on metal hip arthroplasty of the left hip(s). The bone quality appears to be good for age and reported activity level.   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  The patient history, physical examination, clinical judgement of the provider and imaging studies are consistent with previous metal-on-metal total hip arthroplasty of the left hip. Revision total hip arthroplasty is deemed medically necessary. The treatment options including medical management, injection therapy, arthroscopy and arthroplasty were discussed at length. The risks and benefits of total hip arthroplasty were presented and reviewed. The risks due to aseptic loosening, infection, stiffness, dislocation/subluxation,  thromboembolic complications and other imponderables were discussed.  The patient acknowledged the explanation, agreed to proceed with the plan and consent was signed. Patient is being admitted for inpatient treatment for surgery, pain control, PT, OT, prophylactic antibiotics, VTE prophylaxis, progressive ambulation and ADL's and discharge planning. The patient is planning to be discharged home with home health services .    Anastasio Auerbach Delos Klich   PAC  02/27/2013, 11:58 AM

## 2013-03-04 ENCOUNTER — Encounter (HOSPITAL_COMMUNITY): Payer: Self-pay

## 2013-03-04 ENCOUNTER — Encounter (HOSPITAL_COMMUNITY): Payer: Self-pay | Admitting: *Deleted

## 2013-03-04 ENCOUNTER — Ambulatory Visit (HOSPITAL_COMMUNITY): Payer: 59 | Admitting: *Deleted

## 2013-03-04 ENCOUNTER — Encounter (HOSPITAL_COMMUNITY)
Admission: RE | Disposition: A | Discharge status: Home Health | Payer: Self-pay | Source: Ambulatory Visit | Attending: Orthopedic Surgery

## 2013-03-04 ENCOUNTER — Inpatient Hospital Stay (HOSPITAL_COMMUNITY): Payer: 59

## 2013-03-04 ENCOUNTER — Inpatient Hospital Stay (HOSPITAL_COMMUNITY)
Admission: RE | Admit: 2013-03-04 | Discharge: 2013-03-06 | DRG: 467 | Disposition: A | Discharge status: Home Health | Payer: 59 | Source: Ambulatory Visit | Attending: Orthopedic Surgery | Admitting: Orthopedic Surgery

## 2013-03-04 DIAGNOSIS — Z6837 Body mass index (BMI) 37.0-37.9, adult: Secondary | ICD-10-CM

## 2013-03-04 DIAGNOSIS — Z87891 Personal history of nicotine dependence: Secondary | ICD-10-CM

## 2013-03-04 DIAGNOSIS — E039 Hypothyroidism, unspecified: Secondary | ICD-10-CM | POA: Diagnosis present

## 2013-03-04 DIAGNOSIS — Z96649 Presence of unspecified artificial hip joint: Secondary | ICD-10-CM

## 2013-03-04 DIAGNOSIS — R42 Dizziness and giddiness: Secondary | ICD-10-CM | POA: Diagnosis not present

## 2013-03-04 DIAGNOSIS — D500 Iron deficiency anemia secondary to blood loss (chronic): Secondary | ICD-10-CM | POA: Diagnosis not present

## 2013-03-04 DIAGNOSIS — Z01812 Encounter for preprocedural laboratory examination: Secondary | ICD-10-CM

## 2013-03-04 DIAGNOSIS — R7989 Other specified abnormal findings of blood chemistry: Secondary | ICD-10-CM | POA: Diagnosis present

## 2013-03-04 DIAGNOSIS — Y831 Surgical operation with implant of artificial internal device as the cause of abnormal reaction of the patient, or of later complication, without mention of misadventure at the time of the procedure: Secondary | ICD-10-CM | POA: Diagnosis present

## 2013-03-04 DIAGNOSIS — T84099A Other mechanical complication of unspecified internal joint prosthesis, initial encounter: Principal | ICD-10-CM | POA: Diagnosis present

## 2013-03-04 DIAGNOSIS — E669 Obesity, unspecified: Secondary | ICD-10-CM | POA: Diagnosis present

## 2013-03-04 DIAGNOSIS — D62 Acute posthemorrhagic anemia: Secondary | ICD-10-CM | POA: Diagnosis not present

## 2013-03-04 HISTORY — PX: TOTAL HIP REVISION: SHX763

## 2013-03-04 SURGERY — TOTAL HIP REVISION
Anesthesia: General | Site: Hip | Laterality: Left | Wound class: Clean

## 2013-03-04 MED ORDER — ASPIRIN EC 325 MG PO TBEC
325.0000 mg | DELAYED_RELEASE_TABLET | Freq: Two times a day (BID) | ORAL | Status: DC
  Administered 2013-03-05 – 2013-03-06 (×3): 325 mg via ORAL
  Filled 2013-03-04 (×5): qty 1

## 2013-03-04 MED ORDER — MENTHOL 3 MG MT LOZG
1.0000 | LOZENGE | OROMUCOSAL | Status: DC | PRN
  Administered 2013-03-04: 3 mg via ORAL
  Filled 2013-03-04 (×3): qty 9

## 2013-03-04 MED ORDER — PROPOFOL 10 MG/ML IV BOLUS
INTRAVENOUS | Status: DC | PRN
  Administered 2013-03-04: 160 mg via INTRAVENOUS

## 2013-03-04 MED ORDER — DEXAMETHASONE SODIUM PHOSPHATE 10 MG/ML IJ SOLN: 10.0000 mg | Freq: Once | INTRAMUSCULAR | Status: DC

## 2013-03-04 MED ORDER — METHOCARBAMOL 500 MG PO TABS
500.0000 mg | ORAL_TABLET | Freq: Four times a day (QID) | ORAL | Status: DC | PRN
  Administered 2013-03-05 – 2013-03-06 (×3): 500 mg via ORAL
  Filled 2013-03-04 (×4): qty 1

## 2013-03-04 MED ORDER — SODIUM CHLORIDE 0.9 % IR SOLN
Status: DC | PRN
  Administered 2013-03-04: 1000 mL

## 2013-03-04 MED ORDER — DEXAMETHASONE SODIUM PHOSPHATE 10 MG/ML IJ SOLN
10.0000 mg | Freq: Once | INTRAMUSCULAR | Status: AC
  Administered 2013-03-05: 10 mg via INTRAVENOUS
  Filled 2013-03-04: qty 1

## 2013-03-04 MED ORDER — LEVOTHYROXINE SODIUM 100 MCG PO TABS
100.0000 ug | ORAL_TABLET | Freq: Every day | ORAL | Status: DC
  Administered 2013-03-05 – 2013-03-06 (×2): 100 ug via ORAL
  Filled 2013-03-04 (×3): qty 1

## 2013-03-04 MED ORDER — POLYETHYLENE GLYCOL 3350 17 G PO PACK
17.0000 g | PACK | Freq: Two times a day (BID) | ORAL | Status: DC
  Administered 2013-03-04 – 2013-03-06 (×4): 17 g via ORAL

## 2013-03-04 MED ORDER — SODIUM CHLORIDE 0.9 % IV SOLN
100.0000 mL/h | INTRAVENOUS | Status: DC
  Administered 2013-03-04 – 2013-03-05 (×3): 100 mL/h via INTRAVENOUS
  Filled 2013-03-04 (×13): qty 1000

## 2013-03-04 MED ORDER — DOCUSATE SODIUM 100 MG PO CAPS
100.0000 mg | ORAL_CAPSULE | Freq: Two times a day (BID) | ORAL | Status: DC
  Administered 2013-03-04 – 2013-03-06 (×4): 100 mg via ORAL

## 2013-03-04 MED ORDER — DIPHENHYDRAMINE HCL 25 MG PO CAPS: 25.0000 mg | ORAL_CAPSULE | Freq: Four times a day (QID) | ORAL | Status: DC | PRN

## 2013-03-04 MED ORDER — ZOLPIDEM TARTRATE 5 MG PO TABS: 5.0000 mg | ORAL_TABLET | Freq: Every evening | ORAL | Status: DC | PRN

## 2013-03-04 MED ORDER — DEXAMETHASONE SODIUM PHOSPHATE 10 MG/ML IJ SOLN
INTRAMUSCULAR | Status: DC | PRN
  Administered 2013-03-04: 10 mg via INTRAVENOUS

## 2013-03-04 MED ORDER — CEFAZOLIN SODIUM-DEXTROSE 2-3 GM-% IV SOLR
INTRAVENOUS | Status: AC
  Filled 2013-03-04: qty 50

## 2013-03-04 MED ORDER — METOCLOPRAMIDE HCL 10 MG PO TABS: 5.0000 mg | ORAL_TABLET | Freq: Three times a day (TID) | ORAL | Status: DC | PRN

## 2013-03-04 MED ORDER — HYDROMORPHONE HCL PF 1 MG/ML IJ SOLN: 0.2500 mg | INTRAMUSCULAR | Status: DC | PRN

## 2013-03-04 MED ORDER — BISACODYL 10 MG RE SUPP: 10.0000 mg | Freq: Every day | RECTAL | Status: DC | PRN

## 2013-03-04 MED ORDER — HYDROMORPHONE HCL PF 1 MG/ML IJ SOLN
0.5000 mg | INTRAMUSCULAR | Status: DC | PRN
  Filled 2013-03-04: qty 1

## 2013-03-04 MED ORDER — MIDAZOLAM HCL 5 MG/5ML IJ SOLN
INTRAMUSCULAR | Status: DC | PRN
  Administered 2013-03-04: 2 mg via INTRAVENOUS

## 2013-03-04 MED ORDER — CEFAZOLIN SODIUM-DEXTROSE 2-3 GM-% IV SOLR
2.0000 g | INTRAVENOUS | Status: AC
  Administered 2013-03-04: 2 g via INTRAVENOUS

## 2013-03-04 MED ORDER — ALUM & MAG HYDROXIDE-SIMETH 200-200-20 MG/5ML PO SUSP: 30.0000 mL | ORAL | Status: DC | PRN

## 2013-03-04 MED ORDER — FLEET ENEMA 7-19 GM/118ML RE ENEM: 1.0000 | ENEMA | Freq: Once | RECTAL | Status: AC | PRN

## 2013-03-04 MED ORDER — DEXTROSE 5 % IV SOLN
500.0000 mg | Freq: Four times a day (QID) | INTRAVENOUS | Status: DC | PRN
  Administered 2013-03-04: 500 mg via INTRAVENOUS
  Filled 2013-03-04 (×2): qty 5

## 2013-03-04 MED ORDER — ONDANSETRON HCL 4 MG/2ML IJ SOLN: 4.0000 mg | Freq: Four times a day (QID) | INTRAMUSCULAR | Status: DC | PRN

## 2013-03-04 MED ORDER — FENTANYL CITRATE 0.05 MG/ML IJ SOLN
INTRAMUSCULAR | Status: DC | PRN
  Administered 2013-03-04: 100 ug via INTRAVENOUS
  Administered 2013-03-04 (×4): 50 ug via INTRAVENOUS

## 2013-03-04 MED ORDER — PHENOL 1.4 % MT LIQD
1.0000 | OROMUCOSAL | Status: DC | PRN
  Filled 2013-03-04: qty 177

## 2013-03-04 MED ORDER — CEFAZOLIN SODIUM-DEXTROSE 2-3 GM-% IV SOLR
2.0000 g | Freq: Four times a day (QID) | INTRAVENOUS | Status: AC
  Administered 2013-03-04 (×2): 2 g via INTRAVENOUS
  Filled 2013-03-04 (×2): qty 50

## 2013-03-04 MED ORDER — HYDROCODONE-ACETAMINOPHEN 7.5-325 MG PO TABS
1.0000 | ORAL_TABLET | ORAL | Status: DC | PRN
  Administered 2013-03-04 – 2013-03-06 (×6): 1 via ORAL
  Filled 2013-03-04 (×7): qty 1

## 2013-03-04 MED ORDER — NEOSTIGMINE METHYLSULFATE 1 MG/ML IJ SOLN
INTRAMUSCULAR | Status: DC | PRN
  Administered 2013-03-04: 4 mg via INTRAVENOUS

## 2013-03-04 MED ORDER — PHENYLEPHRINE HCL 10 MG/ML IJ SOLN
INTRAMUSCULAR | Status: DC | PRN
  Administered 2013-03-04 (×2): 40 ug via INTRAVENOUS

## 2013-03-04 MED ORDER — LACTATED RINGERS IV SOLN
INTRAVENOUS | Status: DC | PRN
  Administered 2013-03-04 (×3): via INTRAVENOUS

## 2013-03-04 MED ORDER — PROMETHAZINE HCL 25 MG/ML IJ SOLN: 6.2500 mg | INTRAMUSCULAR | Status: DC | PRN

## 2013-03-04 MED ORDER — GLYCOPYRROLATE 0.2 MG/ML IJ SOLN
INTRAMUSCULAR | Status: DC | PRN
  Administered 2013-03-04: .6 mg via INTRAVENOUS

## 2013-03-04 MED ORDER — CELECOXIB 200 MG PO CAPS
200.0000 mg | ORAL_CAPSULE | Freq: Two times a day (BID) | ORAL | Status: DC
  Administered 2013-03-04 – 2013-03-06 (×4): 200 mg via ORAL
  Filled 2013-03-04 (×5): qty 1

## 2013-03-04 MED ORDER — TRAMADOL HCL 50 MG PO TABS: 50.0000 mg | ORAL_TABLET | Freq: Four times a day (QID) | ORAL | Status: DC | PRN

## 2013-03-04 MED ORDER — TRANEXAMIC ACID 100 MG/ML IV SOLN
1000.0000 mg | Freq: Once | INTRAVENOUS | Status: AC
  Administered 2013-03-04: 1000 mg via INTRAVENOUS
  Filled 2013-03-04: qty 10

## 2013-03-04 MED ORDER — CISATRACURIUM BESYLATE (PF) 10 MG/5ML IV SOLN
INTRAVENOUS | Status: DC | PRN
  Administered 2013-03-04: 8 mg via INTRAVENOUS
  Administered 2013-03-04: 3 mg via INTRAVENOUS
  Administered 2013-03-04: 2 mg via INTRAVENOUS

## 2013-03-04 MED ORDER — LACTATED RINGERS IV SOLN: INTRAVENOUS | Status: DC

## 2013-03-04 MED ORDER — FERROUS SULFATE 325 (65 FE) MG PO TABS
325.0000 mg | ORAL_TABLET | Freq: Three times a day (TID) | ORAL | Status: DC
  Administered 2013-03-05 – 2013-03-06 (×5): 325 mg via ORAL
  Filled 2013-03-04 (×8): qty 1

## 2013-03-04 MED ORDER — ONDANSETRON HCL 4 MG/2ML IJ SOLN
INTRAMUSCULAR | Status: DC | PRN
  Administered 2013-03-04: 4 mg via INTRAVENOUS

## 2013-03-04 MED ORDER — METOCLOPRAMIDE HCL 5 MG/ML IJ SOLN: 5.0000 mg | Freq: Three times a day (TID) | INTRAMUSCULAR | Status: DC | PRN

## 2013-03-04 MED ORDER — ONDANSETRON HCL 4 MG PO TABS
4.0000 mg | ORAL_TABLET | Freq: Four times a day (QID) | ORAL | Status: DC | PRN
  Administered 2013-03-04: 4 mg via ORAL
  Filled 2013-03-04: qty 1

## 2013-03-04 SURGICAL SUPPLY — 67 items
ACETAB CUP W/GRIPTION 54 (Plate) ×2 IMPLANT
ADH SKN CLS APL DERMABOND .7 (GAUZE/BANDAGES/DRESSINGS) ×1
BAG SPEC THK2 15X12 ZIP CLS (MISCELLANEOUS) ×1
BAG ZIPLOCK 12X15 (MISCELLANEOUS) ×2 IMPLANT
BLADE SAW SGTL 18X1.27X75 (BLADE) ×2 IMPLANT
BRUSH FEMORAL CANAL (MISCELLANEOUS) IMPLANT
CLOTH BEACON ORANGE TIMEOUT ST (SAFETY) ×2 IMPLANT
CUP ACETAB W/GRIPTION 54 (Plate) IMPLANT
DERMABOND ADVANCED (GAUZE/BANDAGES/DRESSINGS) ×1
DERMABOND ADVANCED .7 DNX12 (GAUZE/BANDAGES/DRESSINGS) ×1 IMPLANT
DRAPE INCISE IOBAN 66X45 STRL (DRAPES) ×1 IMPLANT
DRAPE INCISE IOBAN 85X60 (DRAPES) ×2 IMPLANT
DRAPE ORTHO SPLIT 77X108 STRL (DRAPES) ×4
DRAPE POUCH INSTRU U-SHP 10X18 (DRAPES) ×2 IMPLANT
DRAPE SURG 17X11 SM STRL (DRAPES) ×2 IMPLANT
DRAPE SURG ORHT 6 SPLT 77X108 (DRAPES) ×2 IMPLANT
DRAPE U-SHAPE 47X51 STRL (DRAPES) ×2 IMPLANT
DRSG AQUACEL AG ADV 3.5X10 (GAUZE/BANDAGES/DRESSINGS) ×1 IMPLANT
DRSG AQUACEL AG ADV 3.5X14 (GAUZE/BANDAGES/DRESSINGS) IMPLANT
DRSG EMULSION OIL 3X16 NADH (GAUZE/BANDAGES/DRESSINGS) ×2 IMPLANT
DRSG MEPILEX BORDER 4X4 (GAUZE/BANDAGES/DRESSINGS) ×1 IMPLANT
DRSG MEPILEX BORDER 4X8 (GAUZE/BANDAGES/DRESSINGS) ×1 IMPLANT
DRSG TEGADERM 4X4.75 (GAUZE/BANDAGES/DRESSINGS) ×2 IMPLANT
DURAPREP 26ML APPLICATOR (WOUND CARE) ×2 IMPLANT
ELECT BLADE TIP CTD 4 INCH (ELECTRODE) ×2 IMPLANT
ELECT REM PT RETURN 9FT ADLT (ELECTROSURGICAL) ×2
ELECTRODE REM PT RTRN 9FT ADLT (ELECTROSURGICAL) ×1 IMPLANT
ELIMINATOR HOLE APEX DEPUY (Hips) ×1 IMPLANT
EVACUATOR 1/8 PVC DRAIN (DRAIN) ×2 IMPLANT
FACESHIELD LNG OPTICON STERILE (SAFETY) ×8 IMPLANT
GAUZE SPONGE 2X2 8PLY STRL LF (GAUZE/BANDAGES/DRESSINGS) ×1 IMPLANT
GLOVE BIOGEL PI IND STRL 7.5 (GLOVE) ×1 IMPLANT
GLOVE BIOGEL PI IND STRL 8 (GLOVE) ×1 IMPLANT
GLOVE BIOGEL PI INDICATOR 7.5 (GLOVE) ×1
GLOVE BIOGEL PI INDICATOR 8 (GLOVE) ×1
GLOVE ECLIPSE 8.0 STRL XLNG CF (GLOVE) ×4 IMPLANT
GOWN BRE IMP PREV XXLGXLNG (GOWN DISPOSABLE) ×4 IMPLANT
GOWN STRL NON-REIN LRG LVL3 (GOWN DISPOSABLE) ×2 IMPLANT
GRAFT IC CHAMBER MED (Bone Implant) ×2 IMPLANT
HANDPIECE INTERPULSE COAX TIP (DISPOSABLE) ×2
HEAD CERAMIC 36 PLUS5 (Hips) ×1 IMPLANT
K-WIRE .62 DIA 9 LENGTH (WIRE) ×1 IMPLANT
KIT BASIN OR (CUSTOM PROCEDURE TRAY) ×2 IMPLANT
LINER NEUTRAL 54X36MM PLUS 4 (Hips) ×1 IMPLANT
MANIFOLD NEPTUNE II (INSTRUMENTS) ×2 IMPLANT
NS IRRIG 1000ML POUR BTL (IV SOLUTION) ×4 IMPLANT
PACK TOTAL JOINT (CUSTOM PROCEDURE TRAY) ×2 IMPLANT
POSITIONER SURGICAL ARM (MISCELLANEOUS) ×2 IMPLANT
PRESSURIZER FEMORAL UNIV (MISCELLANEOUS) IMPLANT
SCREW 6.5MMX30MM (Screw) ×1 IMPLANT
SCREW 6.5MMX40MM (Screw) ×1 IMPLANT
SET HNDPC FAN SPRY TIP SCT (DISPOSABLE) IMPLANT
SPONGE GAUZE 2X2 STER 10/PKG (GAUZE/BANDAGES/DRESSINGS) ×1
SPONGE LAP 18X18 X RAY DECT (DISPOSABLE) ×2 IMPLANT
SPONGE LAP 4X18 X RAY DECT (DISPOSABLE) ×1 IMPLANT
STAPLER VISISTAT 35W (STAPLE) ×1 IMPLANT
SUCTION FRAZIER 12FR DISP (SUCTIONS) ×2 IMPLANT
SUCTION FRAZIER TIP 10 FR DISP (SUCTIONS) ×2 IMPLANT
SUT MNCRL AB 3-0 PS2 18 (SUTURE) ×1 IMPLANT
SUT VIC AB 1 CT1 36 (SUTURE) ×4 IMPLANT
SUT VIC AB 2-0 CT1 27 (SUTURE) ×6
SUT VIC AB 2-0 CT1 TAPERPNT 27 (SUTURE) ×3 IMPLANT
SUT VLOC 180 0 24IN GS25 (SUTURE) ×3 IMPLANT
TOWEL OR 17X26 10 PK STRL BLUE (TOWEL DISPOSABLE) ×4 IMPLANT
TOWER CARTRIDGE SMART MIX (DISPOSABLE) IMPLANT
TRAY FOLEY CATH 14FRSI W/METER (CATHETERS) ×2 IMPLANT
WATER STERILE IRR 1500ML POUR (IV SOLUTION) ×3 IMPLANT

## 2013-03-04 NOTE — Plan of Care (Signed)
Problem: Consults Goal: Diagnosis- Total Joint Replacement Revision Total Hip     

## 2013-03-04 NOTE — Anesthesia Postprocedure Evaluation (Signed)
  Anesthesia Post-op Note  Patient: [name]  Procedure(s) Performed: Procedure(s) (LRB): LEFT TOTAL HIP REVISION  (Left)  Patient Location: PACU  Anesthesia Type: General  Level of Consciousness: awake and alert   Airway and Oxygen Therapy: Patient Spontanous Breathing  Post-op Pain: mild  Post-op Assessment: Post-op Vital signs reviewed, Patient's Cardiovascular Status Stable, Respiratory Function Stable, Patent Airway and No signs of Nausea or vomiting  Last Vitals:  Filed Vitals:   03/04/13 1114  BP: 119/87  Pulse: 68  Temp: 36.6 C  Resp: 14    Post-op Vital Signs: stable   Complications: No apparent anesthesia complications

## 2013-03-04 NOTE — Brief Op Note (Signed)
03/04/2013  11:03 AM  PATIENT:  [name]  47 y.o. female  PRE-OPERATIVE DIAGNOSIS:  Failed Left Total Hip Arthroplasty, metallosis   POST-OPERATIVE DIAGNOSIS:  Failed Left Total Hip Arthroplasty, metallosis  PROCEDURE:  Procedure(s): LEFT TOTAL HIP REVISION  (Left)  SURGEON:  Surgeon(s) and Role:    * [name], MD - Primary  PHYSICIAN ASSISTANT: Lanney Gins, PA-C  ANESTHESIA:   general  EBL:  Total I/O In: 3610 [I.V.:3500; IV Piggyback:110] Out: 2275 [Urine:1125; Blood:1150]  BLOOD ADMINISTERED:none  DRAINS: none   LOCAL MEDICATIONS USED:  NONE  SPECIMEN:  Source of Specimen:  left hip implants  DISPOSITION OF SPECIMEN:  PATHOLOGY  COUNTS:  YES  TOURNIQUET:  * No tourniquets in log *  DICTATION: .Other Dictation: Dictation Number [id number]  PLAN OF CARE: Admit to inpatient   PATIENT DISPOSITION:  PACU - hemodynamically stable.   Delay start of Pharmacological VTE agent (>24hrs) due to surgical blood loss or risk of bleeding: no

## 2013-03-04 NOTE — Interval H&P Note (Signed)
History and Physical Interval Note:  03/04/2013 7:31 AM  [name]  has presented today for surgery, with the diagnosis of Failed Left Total Hip Arthroplasty  The various methods of treatment have been discussed with the patient and family. After consideration of risks, benefits and other options for treatment, the patient has consented to  Procedure(s): LEFT TOTAL HIP REVISION VS RESECTION (Left) as a surgical intervention .  The patient's history has been reviewed, patient examined, no change in status, stable for surgery.  I have reviewed the patient's chart and labs.  Questions were answered to the patient's satisfaction.     [name]

## 2013-03-04 NOTE — Anesthesia Preprocedure Evaluation (Signed)
Anesthesia Evaluation  Patient identified by MRN, date of birth, ID band Patient awake    Reviewed: Allergy & Precautions, H&P , NPO status , Patient's Chart, lab work & pertinent test results  Airway Mallampati: II TM Distance: >3 FB Neck ROM: Full    Dental no notable dental hx.    Pulmonary neg pulmonary ROS,  breath sounds clear to auscultation  Pulmonary exam normal       Cardiovascular negative cardio ROS  Rhythm:Regular Rate:Normal     Neuro/Psych negative neurological ROS  negative psych ROS   GI/Hepatic negative GI ROS, Neg liver ROS,   Endo/Other  Hypothyroidism Morbid obesity  Renal/GU negative Renal ROS  negative genitourinary   Musculoskeletal negative musculoskeletal ROS (+)   Abdominal   Peds negative pediatric ROS (+)  Hematology negative hematology ROS (+)   Anesthesia Other Findings   Reproductive/Obstetrics negative OB ROS                           Anesthesia Physical Anesthesia Plan  ASA: II  Anesthesia Plan: General   Post-op Pain Management:    Induction: Intravenous  Airway Management Planned: Oral ETT  Additional Equipment:   Intra-op Plan:   Post-operative Plan: Extubation in OR  Informed Consent: I have reviewed the patients History and Physical, chart, labs and discussed the procedure including the risks, benefits and alternatives for the proposed anesthesia with the patient or authorized representative who has indicated his/her understanding and acceptance.   Dental advisory given  Plan Discussed with: CRNA and Surgeon  Anesthesia Plan Comments:         Anesthesia Quick Evaluation

## 2013-03-04 NOTE — Transfer of Care (Signed)
Immediate Anesthesia Transfer of Care Note  Patient: [name]  Procedure(s) Performed: Procedure(s): LEFT TOTAL HIP REVISION  (Left)  Patient Location: PACU  Anesthesia Type:General  Level of Consciousness: awake, sedated and patient cooperative  Airway & Oxygen Therapy: Patient Spontanous Breathing and Patient connected to face mask oxygen  Post-op Assessment: Report given to PACU RN and Post -op Vital signs reviewed and stable  Post vital signs: Reviewed and stable  Complications: No apparent anesthesia complications

## 2013-03-04 NOTE — Evaluation (Signed)
Physical Therapy Evaluation Patient Details Name: [name] MRN: [id number] DOB: 1966-06-07 Today's Date: 03/04/2013 Time: 4098-1191 PT Time Calculation (min): 21 min  PT Assessment / Plan / Recommendation History of Present Illness  S/p L THA revision 03/04/13  Clinical Impression  On eval POD 0, pt required Mod assist for bed mobility. Sat EOB ~1-2 minutes before pt c/o dizziness (also noted slowed response from pt when questioned or verbally cued). Deferred further mobility and assisted pt back to supine. BP after back in supine-97/66. RN made aware. Recommend HHPT and RW.     PT Assessment  Patient needs continued PT services    Follow Up Recommendations  Home health PT    Does the patient have the potential to tolerate intense rehabilitation      Barriers to Discharge        Equipment Recommendations  Rolling walker with 5" wheels    Recommendations for Other Services OT consult   Frequency 7X/week    Precautions / Restrictions Precautions Precautions: Posterior Hip;Fall Precaution Comments: Reviewed hip precautions Restrictions Weight Bearing Restrictions: No RLE Weight Bearing: Weight bearing as tolerated   Pertinent Vitals/Pain 4/10 L hip       Mobility  Bed Mobility Bed Mobility: Supine to Sit;Sit to Supine Supine to Sit: 3: Mod assist Sit to Supine: 1: +2 Total assist Sit to Supine: Patient Percentage: 50% Details for Bed Mobility Assistance: Assist for L LE and trunk to upright. Increased time. Pt sat EOB for ~1-2 minutes before becoming dizzy. Noted slowed respons and pt c/o dizziness. Increased assistance for sit to supine.  Transfers Transfers: Not assessed Details for Transfer Assistance: Deferred due to dizziness.     Exercises     PT Diagnosis: Difficulty walking;Abnormality of gait;Acute pain;Generalized weakness  PT Problem List: Decreased strength;Decreased range of motion;Decreased activity tolerance;Decreased balance;Decreased  mobility;Pain;Decreased knowledge of use of DME;Decreased knowledge of precautions PT Treatment Interventions: DME instruction;Gait training;Stair training;Functional mobility training;Therapeutic activities;Therapeutic exercise;Patient/family education     PT Goals(Current goals can be found in the care plan section) Acute Rehab PT Goals Patient Stated Goal: home. regain independence PT Goal Formulation: With patient Time For Goal Achievement: 03/18/13 Potential to Achieve Goals: Good  Visit Information  Last PT Received On: 03/04/13 Assistance Needed: +1 History of Present Illness: S/p L THA revision 03/04/13       Prior Functioning  Home Living Family/patient expects to be discharged to:: Private residence Living Arrangements: Spouse/significant other Available Help at Discharge: Family Type of Home: House Home Access: Stairs to enter Secretary/administrator of Steps: 2 Entrance Stairs-Rails: None Home Layout: One level Home Equipment: Cane - single point Prior Function Level of Independence: Independent Communication Communication: No difficulties    Cognition  Cognition Arousal/Alertness: Awake/alert Behavior During Therapy: WFL for tasks assessed/performed Overall Cognitive Status: Within Functional Limits for tasks assessed    Extremity/Trunk Assessment Upper Extremity Assessment Upper Extremity Assessment: Defer to OT evaluation Lower Extremity Assessment Lower Extremity Assessment: LLE deficits/detail LLE Deficits / Details: Hip abd/add 2/5, moves ankle well Cervical / Trunk Assessment Cervical / Trunk Assessment: Normal   Balance    End of Session PT - End of Session Equipment Utilized During Treatment: Gait belt Activity Tolerance: Other (comment) (dizziness, slowed response) Patient left: in bed;with call bell/phone within reach Nurse Communication: Mobility status  GP     Rebeca Alert, MPT Pager: [phone]

## 2013-03-04 NOTE — Progress Notes (Signed)
Utilization review completed.  

## 2013-03-04 NOTE — Preoperative (Signed)
Beta Blockers   Reason not to administer Beta Blockers:Not Applicable 

## 2013-03-05 ENCOUNTER — Encounter (HOSPITAL_COMMUNITY): Payer: Self-pay | Admitting: Orthopedic Surgery

## 2013-03-05 DIAGNOSIS — D500 Iron deficiency anemia secondary to blood loss (chronic): Secondary | ICD-10-CM | POA: Diagnosis not present

## 2013-03-05 DIAGNOSIS — E669 Obesity, unspecified: Secondary | ICD-10-CM | POA: Diagnosis present

## 2013-03-05 LAB — BASIC METABOLIC PANEL
BUN: 8 mg/dL (ref 6–23)
CO2: 27 mEq/L (ref 19–32)
Chloride: 102 mEq/L (ref 96–112)
Creatinine, Ser: 0.89 mg/dL (ref 0.50–1.10)

## 2013-03-05 LAB — CBC
HCT: 25.5 % — ABNORMAL LOW (ref 36.0–46.0)
MCV: 87.6 fL (ref 78.0–100.0)
RBC: 2.91 MIL/uL — ABNORMAL LOW (ref 3.87–5.11)
RDW: 13.5 % (ref 11.5–15.5)
WBC: 16.4 10*3/uL — ABNORMAL HIGH (ref 4.0–10.5)

## 2013-03-05 NOTE — Evaluation (Addendum)
Occupational Therapy Evaluation Patient Details Name: [name] MRN: [id number] DOB: May 13, 1966 Today's Date: 03/05/2013 Time: 1914-7829 OT Time Calculation (min): 16 min  OT Assessment / Plan / Recommendation History of present illness S/p L THA revision 03/04/13   Clinical Impression   Pt was admitted for L THA revision.  Her original surgery was in 2006.  Educated on THPs with ADLs.  Pt will not need any further OT.      OT Assessment  Patient does not need any further OT services    Follow Up Recommendations  No OT follow up    Barriers to Discharge      Equipment Recommendations  3 in 1 bedside comode    Recommendations for Other Services    Frequency       Precautions / Restrictions Precautions Precautions: Posterior Hip;Fall Restrictions Weight Bearing Restrictions: No   Pertinent Vitals/Pain No c/o pain    ADL  Grooming: Wash/dry hands;Supervision/safety Where Assessed - Grooming: Supported standing Toilet Transfer: Radiographer, therapeutic Method: Sit to Barista: Raised toilet seat with arms (or 3-in-1 over toilet) Toileting - Clothing Manipulation and Hygiene: Supervision/safety Where Assessed - Engineer, mining and Hygiene: Standing Equipment Used: Rolling walker Transfers/Ambulation Related to ADLs: ambulated to bathroom with supervision ADL Comments: husband will assist with LB adls:  not interested in AE.  Pt is set up for UB adls and mod to max A for LB adls due to THPS (posterior).  Reviewed precautions and adls.  Towel placed between knees as pt has a tendency to cross at times.   Educated on sidestepping into tub when ready.  She plans to sponge bathe intially.     OT Diagnosis:    OT Problem List:   OT Treatment Interventions:     OT Goals(Current goals can be found in the care plan section)    Visit Information  Last OT Received On: 03/05/13 Assistance Needed: +1 History of Present  Illness: S/p L THA revision 03/04/13       Prior Functioning     Home Living Family/patient expects to be discharged to:: Private residence Living Arrangements: Spouse/significant other Prior Function Level of Independence: Independent Communication Communication: No difficulties         Vision/Perception     Copywriter, advertising Arousal/Alertness: Awake/alert Behavior During Therapy: WFL for tasks assessed/performed Overall Cognitive Status: Within Functional Limits for tasks assessed    Extremity/Trunk Assessment Upper Extremity Assessment Upper Extremity Assessment: Overall WFL for tasks assessed     Mobility Transfers Transfers: Sit to Stand Sit to Stand: 5: Supervision Details for Transfer Assistance: cues to extend legs     Exercise     Balance     End of Session OT - End of Session Activity Tolerance: Patient tolerated treatment well Patient left: in chair;with call bell/phone within reach  GO     [name] 03/05/2013, 12:03 PM [name], OTR/L [phone] 03/05/2013

## 2013-03-05 NOTE — Progress Notes (Signed)
Physical Therapy Treatment Patient Details Name: JARYIAH MEHLMAN MRN: [id number] DOB: [birth year] Today's Date: 03/05/2013 Time: 1335-1400 PT Time Calculation (min): 25 min  PT Assessment / Plan / Recommendation  History of Present Illness S/p L THA revision 03/04/13   PT Comments   POD # 1 pm session.  Amb in hallway second time then practiced going up 2 steps backward using RW.  Assisted to BR to void then back to bed.   Follow Up Recommendations  Home health PT     Does the patient have the potential to tolerate intense rehabilitation     Barriers to Discharge        Equipment Recommendations  Rolling walker with 5" wheels    Recommendations for Other Services    Frequency 7X/week   Progress towards PT Goals Progress towards PT goals: Progressing toward goals  Plan      Precautions / Restrictions Precautions Precautions: Posterior Hip;Fall Precaution Comments: Pt knowledgable of her THP Restrictions Weight Bearing Restrictions: No RLE Weight Bearing: Weight bearing as tolerated    Pertinent Vitals/Pain No c/o pain "soreness"    Mobility  Bed Mobility Bed Mobility: Sit to Supine Supine to Sit: 4: Min guard Sit to Supine: 4: Min assist Details for Bed Mobility Assistance: Min assist to support R LE Transfers Transfers: Sit to Stand;Stand to Sit Sit to Stand: 5: Supervision;From chair/3-in-1;From toilet Stand to Sit: 5: Supervision;To bed;To toilet Details for Transfer Assistance: increased time and good safety cognition Ambulation/Gait Ambulation/Gait Assistance: 5: Supervision Ambulation Distance (Feet): 195 Feet Assistive device: Rolling walker Ambulation/Gait Assistance Details: increased time and good safety cognition Gait Pattern: Step-through pattern Gait velocity: decreased Stairs: Yes Stairs Assistance: 4: Min assist Stairs Assistance Details (indicate cue type and reason): 25% VC's on proper tech and sequencing Stair Management Technique: No  rails;Backwards;With walker Number of Stairs: 2     PT Goals (current goals can now be found in the care plan section)    Visit Information  Last PT Received On: 03/05/13 Assistance Needed: +1 History of Present Illness: S/p L THA revision 03/04/13    Subjective Data      Cognition  Cognition Arousal/Alertness: Awake/alert Behavior During Therapy: WFL for tasks assessed/performed Overall Cognitive Status: Within Functional Limits for tasks assessed    Balance     End of Session PT - End of Session Equipment Utilized During Treatment: Gait belt Activity Tolerance: Patient tolerated treatment well Patient left: in bed;with call bell/phone within reach;with family/visitor present   Felecia Shelling  PTA [facility]  Acute  Rehab Pager      [phone]

## 2013-03-05 NOTE — Progress Notes (Signed)
   Subjective: 1 Day Post-Op Procedure(s) (LRB): LEFT TOTAL HIP REVISION  (Left)   Patient reports pain as mild, pain well controled. No events throughout the night. She does state that she tried PT yesterday, but became very light headed.  She states that this happened after the last surgery on her hip.   Objective:   VITALS:   Filed Vitals:   03/05/13  BP: 101/66  Pulse: 95  Temp: 97.6 F (36.4 C)   Resp: 16    Neurovascular intact Dorsiflexion/Plantar flexion intact Incision: dressing C/D/I No cellulitis present Compartment soft  LABS  Recent Labs  03/05/13 0403  HGB 8.3*  HCT 25.5*  WBC 16.4*  PLT 383     Recent Labs  03/05/13 0403  NA 134*  K 4.2  BUN 8  CREATININE 0.89  GLUCOSE 148*     Assessment/Plan: 1 Day Post-Op Procedure(s) (LRB): LEFT TOTAL HIP REVISION  (Left) Foley cath d/c'ed Advance diet Up with therapy D/C IV fluids Discharge home with home health eventually when ready  Expected ABLA  Treated with iron and will observe  Obese (BMI 30-39.9) Estimated body mass index is 37.3 kg/(m^2) as calculated from the following:   Height as of this encounter: 5\' 6"  (1.676 m).   Weight as of this encounter: 104.781 kg (231 lb). Patient also counseled that weight may inhibit the healing process Patient counseled that losing weight will help with future health issues       Anastasio Auerbach. Cecia Egge   PAC  03/05/2013, 8:12 AM

## 2013-03-05 NOTE — Progress Notes (Signed)
Physical Therapy Treatment Patient Details Name: [name] MRN: [id number] DOB: 05-May-1966 Today's Date: 03/05/2013 Time: 7829-5621 PT Time Calculation (min): 39 min  PT Assessment / Plan / Recommendation  History of Present Illness S/p L THA revision 03/04/13   PT Comments   POD # 1 am session.  Pt feeling better with no c/o dizziness this session.  Assisted pt OOB to amb in hallway, then performed hip TE's.  Assisted to BR. Pt plans to D/C to home with spouse.   Follow Up Recommendations  Home health PT     Does the patient have the potential to tolerate intense rehabilitation     Barriers to Discharge        Equipment Recommendations  Rolling walker with 5" wheels    Recommendations for Other Services    Frequency 7X/week   Progress towards PT Goals Progress towards PT goals: Progressing toward goals  Plan      Precautions / Restrictions Precautions Precautions: Posterior Hip;Fall Precaution Comments: Pt knowledgable of her THP Restrictions Weight Bearing Restrictions: No RLE Weight Bearing: Weight bearing as tolerated    Pertinent Vitals/Pain No pain just "soreness"    Mobility  Bed Mobility Bed Mobility: Supine to Sit Supine to Sit: 4: Min guard Details for Bed Mobility Assistance: increased time with no c/o dizzyness today Transfers Transfers: Sit to Stand;Stand to Sit Sit to Stand: 5: Supervision;4: Min guard;From bed;From chair/3-in-1;From toilet Stand to Sit: 5: Supervision;4: Min guard;To chair/3-in-1;To toilet Details for Transfer Assistance: one VC on hand placement and increased time.  No c/o dizzyness. Ambulation/Gait Ambulation/Gait Assistance: 5: Supervision;4: Min guard Ambulation Distance (Feet): 185 Feet Assistive device: Rolling walker Ambulation/Gait Assistance Details: <25% Vc's on safety with turns and side stepping into bathroom.   Gait Pattern: Step-through pattern Gait velocity: decreased    Exercises   Total Hip Replacement  TE's 10 reps ankle pumps 10 reps knee presses 10 reps heel slides 10 reps SAQ's 10 reps ABD Followed by [facility]    PT Goals (current goals can now be found in the care plan section)    Visit Information  Last PT Received On: 03/05/13 Assistance Needed: +1 History of Present Illness: S/p L THA revision 03/04/13    Subjective Data      Cognition  Cognition Arousal/Alertness: Awake/alert Behavior During Therapy: [facility] for tasks assessed/performed Overall Cognitive Status: Within Functional Limits for tasks assessed    Balance     End of Session PT - End of Session Equipment Utilized During Treatment: Gait belt Activity Tolerance: Patient tolerated treatment well Patient left: in chair;with call bell/phone within reach;with family/visitor present   Felecia Shelling  PTA [facility]  Acute  Rehab Pager      [phone]

## 2013-03-05 NOTE — Care Management Note (Signed)
  Page 1 of 1   03/05/2013     4:43:32 PM   CARE MANAGEMENT NOTE 03/05/2013  Patient:  [name], [name]   Account Number:  [id number]  Date Initiated:  03/05/2013  Documentation initiated by:  Colleen Can  Subjective/Objective Assessment:   dx revision left total hip arthroplasty     Action/Plan:   CM spoke with patient and spouse. Plans are for patient to return to her home in [place] where spouse will be caregiver. Genevieve Norlander will provide [facility] services. She will need RW and 3N1.   Anticipated DC Date:  03/07/2013   Anticipated DC Plan:  HOME W HOME HEALTH SERVICES      DC Planning Services  CM consult      [facility] Choice  HOME HEALTH  DURABLE MEDICAL EQUIPMENT   Choice offered to / List presented to:  C-1 Patient        HH arranged  HH-2 PT      [facility] agency  [facility]   Status of service:  In process, will continue to follow Medicare Important Message given?   (If response is "NO", the following Medicare IM given date fields will be blank) Date Medicare IM given:   Date Additional Medicare IM given:    Discharge Disposition:    Per UR Regulation:    If discussed at Long Length of Stay Meetings, dates discussed:    Comments:

## 2013-03-05 NOTE — Op Note (Signed)
[name], [name] NO.:  [id number]  MEDICAL RECORD NO.:  [id number]  LOCATION:  1604                         FACILITY:  [facility]  PHYSICIAN:  [name], M.D.  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  PROCEDURE:  Revision of left total hip arthroplasty with allograft, bone graft utilizing DePuy revision cup using the size 54 Gription Pinnacle cup, two cancellous bone screws, a 36+ 4 neutral AltrX liner, a 36+ 5 Delta ceramic ball.  SURGEON:  [name], M.D.  ASSISTANT:  Brittney Gins, PA-C.  Note that [name] was present for the entirety of the case from preoperative positioning, perioperative management of the upper extremity, general facilitation of the case, and primary wound closure.  ANESTHESIA:  General.  SPECIMENS:  The patient's acetabular shell liner and ball were all sent to Pathology for evaluation, subsequently given to the patient.  DRAINS:  None.  BLOOD LOSS:  About 1100-1200 mL.  COMPLICATION:  None apparent.  FINDINGS:  The patient was noted to have a metal-stained synovial lining including into the area of the iliopsoas tendon and bursa region.  There was no evidence of any muscle destruction, single acetabular cyst noted into the anterior and superior aspect of the acetabulum.  This was packed with bone graft.  INDICATION FOR PROCEDURE:  [name] is a 47 year old female with history of left total hip arthroplasty.  She had been doing well until recently presented to [name], her primary surgeon with increasing pain.  Workup was more concerning of a metallosis based on elevated serum cobalt and chromium levels.  She did have an elevated serum C- reactive  protein and sedimentation rate, with a hip aspirate of 8000 white cell.  Based on her presentation, based on predominantly acute serum cobalt and chromium levels, I felt this was more of an issue metallosis as opposed to any infection.  We discussed the fact that we had plan to revise her hip, but would be prepared to address infection if this were be unfortunately seen.  Risks of infection, dislocation and revision setting were all discussed and reviewed.  Consent was obtained for benefit of pain relief.  PROCEDURE IN DETAIL:  The patient was brought to the operative theater. Once adequate anesthesia, preoperative antibiotics, Ancef administered, the patient was positioned into the right lateral decubitus position with the left side up.  The left lower extremity was then prepped and draped in sterile fashion.  Time-out was performed identifying the patient, planned procedure, and extremity.  The patient's old incision was then identified and utilized.  Soft tissue dissection was carried to the iliotibial band.  Exposure of this level was obtained, a main incision through the iliotibial band and gluteal fascia.  As I incised and went through the gluteus maximus, I did identify a metal-stained synovium, synovial lining.  I then incised this and evacuated the synovial fluid from this area.  We then spent time at this point  exposing the posterior aspect of the hip including performing significant synovectomy over this entire 2/3 posterior, 2/3 of the femur of the hip.  Once the posterior aspect of the hip was exposed, the hip was dislocated.  The femoral head was removed and taken off the field.  At this point, attention was directed to work on the acetabulum as well as further debridement around the synovium in the anterior aspect of the hip including the area of the iliopsoas tendon and bursa.  The metal liner was eventually removed via impacted metal rim using the suction cup.  I  then was able to remove the single cancellous screw in the ilium and then using the Innomed Explant system, removed the acetabular shell with minimal bone loss.  Further debridement of synovium was carried out and using a curette, I removed some of the metal stained in an acetabular cysts in the anterior superior aspect of the acetabulum.  Once this had all been carried out, I reamed the acetabulum with a 52-mm reamer.  I then placed 10 mL of cancellous bone graft and reversed reamed to filled the cyst and any voids in the acetabulum.  I selected a 54 Pinnacle Gription cup, which was impacted with a curved impactor with good scratch fit despite shallow acetabulum with limited posterior wall or anterior wall bone from previous bone preparation.  I placed two cancellous screws into the ilium to help with this initial fixation.  The cup appeared at this point in position at 35-40 degrees of abduction and 20 degrees of forward flexion, I placed a trial liner and did a trial reduction with 36+ 5 ball and felt the hip was very stable without evidence of impingement or subluxation.  Given all these findings, I dislocated the hip, removed the trial components, placed a hole eliminator, and then placed the final 36+ 4 neutral AltrX liner.  I then impacted on the clean and dry trunnion, the 36+ 5 Delta ceramic ball.  The hip was then reduced.  The hip was irrigated throughout the case.  Prior to placement of final components, I did irrigate the hip with 1 liter of normal saline solution, pulse lavage for further debridement of any metal debris as possible.  Note that it was at this point virtually impossible to remove all the metal-stained synovial fluid, but I would have to estimate at least 90% of it was removed.  At this point, the hip was reapproximated in layers using the combination of #1 Vicryl and 0 V-Loc suture.  We reapproximated the iliotibial band and gluteal fascia.  The remainder  of the wound was closed with 2-0 Vicryl and running 3-0 Monocryl.  The hip was cleaned, dried and dressed sterilely using an Aquacel dressing.  She was then brought to the recovery room in stable condition tolerating the procedure well.     Brittney Frankel Charlann [name], M.D.     MDO/MEDQ  D:  03/04/2013  T:  03/05/2013  Job:  [id number]

## 2013-03-05 NOTE — Progress Notes (Signed)
[facility]   [facility] is providing the following services: Rolling walker and commode  If patient discharges after hours, please call [phone].   Renard Hamper 03/05/2013, 9:27 AM

## 2013-03-06 LAB — BASIC METABOLIC PANEL
BUN: 9 mg/dL (ref 6–23)
Creatinine, Ser: 0.77 mg/dL (ref 0.50–1.10)
GFR calc Af Amer: >90 mL/min (ref >90)
GFR calc non Af Amer: >90 mL/min (ref >90)

## 2013-03-06 LAB — CBC
HCT: 20.9 % — ABNORMAL LOW (ref 36.0–46.0)
MCHC: 33.5 g/dL (ref 30.0–36.0)
MCV: 88.2 fL (ref 78.0–100.0)
RDW: 13.8 % (ref 11.5–15.5)

## 2013-03-06 MED ORDER — FERROUS SULFATE 325 (65 FE) MG PO TABS: 325.0000 mg | ORAL_TABLET | Freq: Three times a day (TID) | ORAL | Status: DC

## 2013-03-06 MED ORDER — DSS 100 MG PO CAPS: 100.0000 mg | ORAL_CAPSULE | Freq: Two times a day (BID) | ORAL | Status: DC

## 2013-03-06 MED ORDER — ASPIRIN 325 MG PO TBEC: 325.0000 mg | DELAYED_RELEASE_TABLET | Freq: Two times a day (BID) | ORAL | Status: AC

## 2013-03-06 MED ORDER — POLYETHYLENE GLYCOL 3350 17 G PO PACK: 17.0000 g | PACK | Freq: Two times a day (BID) | ORAL | Status: DC

## 2013-03-06 MED ORDER — METHOCARBAMOL 500 MG PO TABS: 500.0000 mg | ORAL_TABLET | Freq: Four times a day (QID) | ORAL | Status: DC | PRN

## 2013-03-06 MED ORDER — HYDROCODONE-ACETAMINOPHEN 7.5-325 MG PO TABS: 1.0000 | ORAL_TABLET | ORAL | Status: DC | PRN

## 2013-03-06 NOTE — Progress Notes (Signed)
Physical Therapy Treatment Patient Details Name: [name] MRN: [id number] DOB: [birth year] Today's Date: 03/06/2013 Time: 1478-2956 PT Time Calculation (min): 29 min  PT Assessment / Plan / Recommendation  History of Present Illness     PT Comments     Follow Up Recommendations  Home health PT     Does the patient have the potential to tolerate intense rehabilitation     Barriers to Discharge        Equipment Recommendations  Rolling walker with 5" wheels    Recommendations for Other Services OT consult  Frequency 7X/week   Progress towards PT Goals Progress towards PT goals: Progressing toward goals  Plan Current plan remains appropriate    Precautions / Restrictions Precautions Precautions: Posterior Hip;Fall Precaution Comments: Pt knowledgable of her THP Restrictions Weight Bearing Restrictions: No RLE Weight Bearing: Weight bearing as tolerated   Pertinent Vitals/Pain 1/10; premed, ice pack provided    Mobility  Bed Mobility Bed Mobility: Supine to Sit Supine to Sit: 5: Supervision Details for Bed Mobility Assistance: pt self assisting L LE with UEs Transfers Transfers: Sit to Stand;Stand to Sit Sit to Stand: 5: Supervision;From bed;With upper extremity assist Stand to Sit: 5: Supervision;To chair/3-in-1;With armrests Details for Transfer Assistance: cues for use of UEs Ambulation/Gait Ambulation/Gait Assistance: 5: Supervision Ambulation Distance (Feet): 400 Feet Assistive device: Rolling walker Ambulation/Gait Assistance Details: min cues for ER on L Gait Pattern: Step-through pattern Gait velocity: decreased General Gait Details: pt progressing to recip gait Stairs: Yes Stairs Assistance: 4: Min assist Stairs Assistance Details (indicate cue type and reason): min cues for RW placement Stair Management Technique: No rails;Backwards;With walker Number of Stairs: 4    Exercises Total Joint Exercises Ankle Circles/Pumps: AROM;15  reps;Supine;Both Quad Sets: AROM;Both;10 reps;Supine Gluteal Sets: AROM;10 reps;Supine;Both Heel Slides: AAROM;20 reps;Supine;Left Hip ABduction/ADduction: AAROM;Left;20 reps;Supine   PT Diagnosis:    PT Problem List:   PT Treatment Interventions:     PT Goals (current goals can now be found in the care plan section) Acute Rehab PT Goals Patient Stated Goal: home. regain independence PT Goal Formulation: With patient Time For Goal Achievement: 03/18/13 Potential to Achieve Goals: Good  Visit Information  Last PT Received On: 03/06/13 Assistance Needed: +1    Subjective Data  Patient Stated Goal: home. regain independence   Cognition  Cognition Arousal/Alertness: Awake/alert Behavior During Therapy: WFL for tasks assessed/performed Overall Cognitive Status: Within Functional Limits for tasks assessed    Balance     End of Session PT - End of Session Activity Tolerance: Patient tolerated treatment well Patient left: in chair;with call bell/phone within reach;with family/visitor present Nurse Communication: Mobility status   GP     Arien Benincasa 03/06/2013, 9:34 AM

## 2013-03-06 NOTE — Progress Notes (Signed)
   Subjective: 2 Days Post-Op Procedure(s) (LRB): LEFT TOTAL HIP REVISION  (Left)   Patient reports pain as mild, pain well controlled. No events throughout the night. Few symptoms when initially getting up, but doing well with PT today.  Ready to be discharged home  Objective:   VITALS:   Filed Vitals:   03/06/13 0534  BP: 104/69  Pulse: 83  Temp: 97.8 F (36.6 C)  Resp: 14    Neurovascular intact Dorsiflexion/Plantar flexion intact Incision: dressing C/D/I No cellulitis present Compartment soft  LABS  Recent Labs  03/05/13 0403 03/06/13 0417  HGB 8.3* 7.0*  HCT 25.5* 20.9*  WBC 16.4* 12.8*  PLT 383 284     Recent Labs  03/05/13 0403 03/06/13 0417  NA 134* 137  K 4.2 3.9  BUN 8 9  CREATININE 0.89 0.77  GLUCOSE 148* 122*     Assessment/Plan: 2 Days Post-Op Procedure(s) (LRB): LEFT TOTAL HIP REVISION  (Left) Up with therapy Discharge home with home health Follow up in 2 weeks at [facility] Follow up with OLIN,Chrystian Ressler D in 2 weeks.  Contact information:  [facility] [address] [place] [address] [phone]    Expected ABLA  Treated with 1 unit of blood prior to leaving   Obese (BMI 30-39.9)  Estimated body mass index is 37.3 kg/(m^2) as calculated from the following:      Height as of this encounter: 5\' 6"  (1.676 m).      Weight as of this encounter: 104.781 kg (231 lb).  Patient also counseled that weight may inhibit the healing process  Patient counseled that losing weight will help with future health issues         [name]. [name]   PAC  03/06/2013, 10:23 AM

## 2013-03-07 LAB — TYPE AND SCREEN: ABO/RH(D): A POS

## 2013-03-07 NOTE — Discharge Summary (Signed)
Physician Discharge Summary  Patient ID: [name] MRN: [id number] DOB/AGE: Apr 24, 1966 47 y.o.  Admit date: 03/04/2013 Discharge date: 03/06/2013   Procedures:  Procedure(s) (LRB): LEFT TOTAL HIP REVISION  (Left)  Attending Physician:  Dr. Durene Romans   Admission Diagnoses:   Left hip pain S/P total hip arthroplasty  Discharge Diagnoses:  Principal Problem:   S/P left TH revision Active Problems:   Expected blood loss anemia   Obese  Past Medical History  Diagnosis Date  . Thyroid disease     Hypothyroid on replacement therapy  . Headache(784.0) 6 years ago  . Arthritis   . Anemia     "younger days"  . History of blood transfusion     HPI: [name], 47 y.o. female, has a history of pain and functional disability in the left hip due to pain from previous metal-on-metal hip replacement. The indications for the revision total hip arthroplasty are pain from a metal-on-metal hip replacement. Onset of symptoms was gradual starting 2 months ago with rapidlly worsening course since that time. Prior procedures on the left hip include arthroplasty. Patient currently rates pain in the left hip at 10 out of 10 with activity. There is night pain, worsening of pain with activity and weight bearing, trendelenberg gait, pain that interfers with activities of daily living and pain with passive range of motion. Patient has evidence of previous metal-on-metal total hip arthroplasty by imaging studies. This condition presents safety issues increasing the risk of falls. This patient has had previous labs of sed rate: 30 (0-22); CRP 4.6 (< 0.60); Cobalt 51.1 (<1.9); Chromium 16.2 (<= 1.2). We have discussed at length the possibility that if infection is evident that we would have to resect the left hip and place an antibiotic spacer, and treat with IV antibiotic through a PICC line for 6 weeks. She states that she understands. Risks, benefits and expectations were discussed with the  patient. Patient understand the risks, benefits and expectations and wishes to proceed with surgery.   PCP: Karie Chimera, MD   Discharged Condition: good  Hospital Course:  Patient underwent the above stated procedure on 03/04/2013. Patient tolerated the procedure well and brought to the recovery room in good condition and subsequently to the floor.  POD #1 BP: 101/66 ; Pulse: 95 ; Temp: 97.6 F (36.4 C) ; Resp: 16  Pt's foley was removed, as well as the hemovac drain removed. IV was changed to a saline lock. Patient reports pain as mild, pain well controled. No events throughout the night. She does state that she tried PT yesterday, but became very light headed. She states that this happened after the last surgery on her hip.  Neurovascular intact, dorsiflexion/plantar flexion intact, incision: dressing C/D/I, no cellulitis present and compartment soft.   LABS  Basename    HGB  8.3  HCT  25.5   POD #2  BP: 104/69 ; Pulse: 83 ; Temp: 97.8 F (36.6 C) ; Resp: 14  Patient reports pain as mild, pain well controlled. No events throughout the night. Few symptoms when initially getting up, but doing well with PT today. Received 1 unit of blood prior to leaving the hospital.  Ready to be discharged home. Neurovascular intact, dorsiflexion/plantar flexion intact, incision: dressing C/D/I, no cellulitis present and compartment soft.   LABS  Basename    HGB  7.0  HCT  20.9    Discharge Exam: General appearance: alert, cooperative and no distress Extremities: Homans sign is negative, no  sign of DVT, no edema, redness or tenderness in the calves or thighs and no ulcers, gangrene or trophic changes  Disposition:   Home-Health Care Svc with follow up in 2 weeks   Follow-up Information   Follow up with [name], MD. Schedule an appointment as soon as possible for a visit in 2 weeks.   Specialty:  Orthopedic Surgery   Contact information:   [address] [place]  [place] [address] [phone]       Discharge Orders   Future Orders Complete By Expires   Call MD / Call 911  As directed    Comments:     If you experience chest pain or shortness of breath, CALL 911 and be transported to the hospital emergency room.  If you develope a fever above 101 F, pus (white drainage) or increased drainage or redness at the wound, or calf pain, call your surgeon's office.   Change dressing  As directed    Comments:     Maintain surgical dressing for 10-14 days, then replace with 4x4 guaze and tape. Keep the area dry and clean.   Constipation Prevention  As directed    Comments:     Drink plenty of fluids.  Prune juice may be helpful.  You may use a stool softener, such as Colace (over the counter) 100 mg twice a day.  Use MiraLax (over the counter) for constipation as needed.   Diet - low sodium heart healthy  As directed    Discharge instructions  As directed    Comments:     Maintain surgical dressing for 10-14 days, then replace with gauze and tape. Keep the area dry and clean until follow up. Follow up in 2 weeks at [facility]. Call with any questions or concerns.   Increase activity slowly as tolerated  As directed    TED hose  As directed    Comments:     Use stockings (TED hose) for 2 weeks on both leg(s).  You may remove them at night for sleeping.   Weight bearing as tolerated  As directed         Medication List    STOP taking these medications       aspirin 81 MG tablet  Replaced by:  aspirin 325 MG EC tablet      TAKE these medications       aspirin 325 MG EC tablet  Take 1 tablet (325 mg total) by mouth 2 (two) times daily.     DSS 100 MG Caps  Take 100 mg by mouth 2 (two) times daily.     ferrous sulfate 325 (65 FE) MG tablet  Take 1 tablet (325 mg total) by mouth 3 (three) times daily after meals.     HYDROcodone-acetaminophen 7.5-325 MG per tablet  Commonly known as:  NORCO  Take 1-2 tablets by mouth every 4 (four)  hours as needed.     levothyroxine 100 MCG tablet  Commonly known as:  SYNTHROID, LEVOTHROID  Take 100 mcg by mouth daily before breakfast.     methocarbamol 500 MG tablet  Commonly known as:  ROBAXIN  Take 1 tablet (500 mg total) by mouth every 6 (six) hours as needed.     polyethylene glycol packet  Commonly known as:  MIRALAX / GLYCOLAX  Take 17 g by mouth 2 (two) times daily.         Signed: Anastasio Auerbach. Heatherly Stenner   PAC  03/07/2013, 9:56 AM

## 2013-07-31 ENCOUNTER — Other Ambulatory Visit: Payer: Self-pay

## 2013-07-31 DIAGNOSIS — Z1231 Encounter for screening mammogram for malignant neoplasm of breast: Secondary | ICD-10-CM

## 2013-08-08 ENCOUNTER — Ambulatory Visit
Admission: RE | Admit: 2013-08-08 | Discharge: 2013-08-08 | Disposition: A | Discharge status: Home / Self Care | Payer: 59 | Source: Ambulatory Visit

## 2013-08-08 DIAGNOSIS — Z1231 Encounter for screening mammogram for malignant neoplasm of breast: Secondary | ICD-10-CM

## 2013-11-12 ENCOUNTER — Encounter (HOSPITAL_COMMUNITY): Payer: Self-pay | Admitting: Pharmacy Technician

## 2013-11-13 ENCOUNTER — Other Ambulatory Visit (HOSPITAL_COMMUNITY): Payer: Self-pay | Admitting: Orthopedic Surgery

## 2013-11-13 NOTE — Patient Instructions (Addendum)
20 [name]  11/13/2013   Your procedure is scheduled on: Monday June 1st, 2015  Report to [facility] Main Entrance and follow signs to  [name] at  1120 AM.  Call this number if you have problems the morning of surgery [phone]   Remember:  Do not eat food  :After Midnight.   clear liquids midnight until 720 am day of surgery, then nothing  by mouth.   Take these medicines the morning of surgery with A SIP OF WATER: Levothyroxine                               You may not have any metal on your body including hair pins and piercings  Do not wear jewelry, make-up, lotions, powders, or deodorant.   Men may shave face and neck.  Do not bring valuables to the hospital. [place].  Contacts, dentures or bridgework may not be worn into surgery.  Leave suitcase in the car. After surgery it may be brought to your room.  For patients admitted to the hospital, checkout time is 11:00 AM the day of discharge.   Patients discharged the day of surgery will not be allowed to drive home.  Name and phone number of your driver:  Special Instructions: N/A  ________________________________________________________________________  [facility] - Preparing for Surgery Before surgery, you can play an important role.  Because skin is not sterile, your skin needs to be as free of germs as possible.  You can reduce the number of germs on your skin by washing with CHG (chlorahexidine gluconate) soap before surgery.  CHG is an antiseptic cleaner which kills germs and bonds with the skin to continue killing germs even after washing. Please DO NOT use if you have an allergy to CHG or antibacterial soaps.  If your skin becomes reddened/irritated stop using the CHG and inform your nurse when you arrive at Short Stay. Do not shave (including legs and underarms) for at least 48 hours prior to the first CHG shower.  You may shave your face/neck. Please  follow these instructions carefully:  1.  Shower with CHG Soap the night before surgery and the  morning of Surgery.  2.  If you choose to wash your hair, wash your hair first as usual with your  normal  shampoo.  3.  After you shampoo, rinse your hair and body thoroughly to remove the  shampoo.                           4.  Use CHG as you would any other liquid soap.  You can apply chg directly  to the skin and wash                       Gently with a scrungie or clean washcloth.  5.  Apply the CHG Soap to your body ONLY FROM THE NECK DOWN.   Do not use on face/ open                           Wound or open sores. Avoid contact with eyes, ears mouth and genitals (private parts).                       Wash face,  Genitals (  private parts) with your normal soap.             6.  Wash thoroughly, paying special attention to the area where your surgery  will be performed.  7.  Thoroughly rinse your body with warm water from the neck down.  8.  DO NOT shower/wash with your normal soap after using and rinsing off  the CHG Soap.                9.  Pat yourself dry with a clean towel.            10.  Wear clean pajamas.            11.  Place clean sheets on your bed the night of your first shower and do not  sleep with pets. Day of Surgery : Do not apply any lotions the morning of surgery.  Please wear clean clothes to the hospital/surgery center.  FAILURE TO FOLLOW THESE INSTRUCTIONS MAY RESULT IN THE CANCELLATION OF YOUR SURGERY PATIENT SIGNATURE_________________________________  NURSE SIGNATURE__________________________________  ________________________________________________________________________    CLEAR LIQUID DIET   Foods Allowed                                                                     Foods Excluded  Coffee and tea, regular and decaf                             liquids that you cannot  Plain Jell-O in any flavor                                             see through such  as: Fruit ices (not with fruit pulp)                                     milk, soups, orange juice  Iced Popsicles                                    All solid food Carbonated beverages, regular and diet                                    Cranberry, grape and apple juices Sports drinks like Gatorade Lightly seasoned clear broth or consume(fat free) Sugar, honey syrup  Sample Menu Breakfast                                Lunch                                     Supper Cranberry juice                    Beef broth  Chicken broth Jell-O                                     Grape juice                           Apple juice Coffee or tea                        Jell-O                                      Popsicle                                                Coffee or tea                        Coffee or tea  _____________________________________________________________________    Incentive Spirometer  An incentive spirometer is a tool that can help keep your lungs clear and active. This tool measures how well you are filling your lungs with each breath. Taking long deep breaths may help reverse or decrease the chance of developing breathing (pulmonary) problems (especially infection) following:  A long period of time when you are unable to move or be active. BEFORE THE PROCEDURE   If the spirometer includes an indicator to show your best effort, your nurse or respiratory therapist will set it to a desired goal.  If possible, sit up straight or lean slightly forward. Try not to slouch.  Hold the incentive spirometer in an upright position. INSTRUCTIONS FOR USE  1. Sit on the edge of your bed if possible, or sit up as far as you can in bed or on a chair. 2. Hold the incentive spirometer in an upright position. 3. Breathe out normally. 4. Place the mouthpiece in your mouth and seal your lips tightly around it. 5. Breathe in slowly and as deeply as possible,  raising the piston or the ball toward the top of the column. 6. Hold your breath for 3-5 seconds or for as long as possible. Allow the piston or ball to fall to the bottom of the column. 7. Remove the mouthpiece from your mouth and breathe out normally. 8. Rest for a few seconds and repeat Steps 1 through 7 at least 10 times every 1-2 hours when you are awake. Take your time and take a few normal breaths between deep breaths. 9. The spirometer may include an indicator to show your best effort. Use the indicator as a goal to work toward during each repetition. 10. After each set of 10 deep breaths, practice coughing to be sure your lungs are clear. If you have an incision (the cut made at the time of surgery), support your incision when coughing by placing a pillow or rolled up towels firmly against it. Once you are able to get out of bed, walk around indoors and cough well. You may stop using the incentive spirometer when instructed by your caregiver.  RISKS AND COMPLICATIONS  Take your time so you do not get dizzy or light-headed.  If you are in pain, you  may need to take or ask for pain medication before doing incentive spirometry. It is harder to take a deep breath if you are having pain. AFTER USE  Rest and breathe slowly and easily.  It can be helpful to keep track of a log of your progress. Your caregiver can provide you with a simple table to help with this. If you are using the spirometer at home, follow these instructions: SEEK MEDICAL CARE IF:   You are having difficultly using the spirometer.  You have trouble using the spirometer as often as instructed.  Your pain medication is not giving enough relief while using the spirometer.  You develop fever of 100.5 F (38.1 C) or higher. SEEK IMMEDIATE MEDICAL CARE IF:   You cough up bloody sputum that had not been present before.  You develop fever of 102 F (38.9 C) or greater.  You develop worsening pain at or near the  incision site. MAKE SURE YOU:   Understand these instructions.  Will watch your condition.  Will get help right away if you are not doing well or get worse. Document Released: 10/24/2006 Document Revised: 09/05/2011 Document Reviewed: 12/25/2006 [employer] Patient Information 2014 Exit[employer], [place].   ________________________________________________________________________  WHAT IS A BLOOD TRANSFUSION? Blood Transfusion Information  A transfusion is the replacement of blood or some of its parts. Blood is made up of multiple cells which provide different functions.  Red blood cells carry oxygen and are used for blood loss replacement.  White blood cells fight against infection.  Platelets control bleeding.  Plasma helps clot blood.  Other blood products are available for specialized needs, such as hemophilia or other clotting disorders. BEFORE THE TRANSFUSION  Who gives blood for transfusions?   Healthy volunteers who are fully evaluated to make sure their blood is safe. This is blood bank blood. Transfusion therapy is the safest it has ever been in the practice of medicine. Before blood is taken from a donor, a complete history is taken to make sure that person has no history of diseases nor engages in risky social behavior (examples are intravenous drug use or sexual activity with multiple partners). The donor's travel history is screened to minimize risk of transmitting infections, such as malaria. The donated blood is tested for signs of infectious diseases, such as HIV and hepatitis. The blood is then tested to be sure it is compatible with you in order to minimize the chance of a transfusion reaction. If you or a relative donates blood, this is often done in anticipation of surgery and is not appropriate for emergency situations. It takes many days to process the donated blood. RISKS AND COMPLICATIONS Although transfusion therapy is very safe and saves many lives, the main dangers  of transfusion include:   Getting an infectious disease.  Developing a transfusion reaction. This is an allergic reaction to something in the blood you were given. Every precaution is taken to prevent this. The decision to have a blood transfusion has been considered carefully by your caregiver before blood is given. Blood is not given unless the benefits outweigh the risks. AFTER THE TRANSFUSION  Right after receiving a blood transfusion, you will usually feel much better and more energetic. This is especially true if your red blood cells have gotten low (anemic). The transfusion raises the level of the red blood cells which carry oxygen, and this usually causes an energy increase.  The nurse administering the transfusion will monitor you carefully for complications. HOME CARE INSTRUCTIONS  No  special instructions are needed after a transfusion. You may find your energy is better. Speak with your caregiver about any limitations on activity for underlying diseases you may have. SEEK MEDICAL CARE IF:   Your condition is not improving after your transfusion.  You develop redness or irritation at the intravenous (IV) site. SEEK IMMEDIATE MEDICAL CARE IF:  Any of the following symptoms occur over the next 12 hours:  Shaking chills.  You have a temperature by mouth above 102 F (38.9 C), not controlled by medicine.  Chest, back, or muscle pain.  People around you feel you are not acting correctly or are confused.  Shortness of breath or difficulty breathing.  Dizziness and fainting.  You get a rash or develop hives.  You have a decrease in urine output.  Your urine turns a dark color or changes to pink, red, or brown. Any of the following symptoms occur over the next 10 days:  You have a temperature by mouth above 102 F (38.9 C), not controlled by medicine.  Shortness of breath.  Weakness after normal activity.  The white part of the eye turns yellow (jaundice).  You  have a decrease in the amount of urine or are urinating less often.  Your urine turns a dark color or changes to pink, red, or brown. Document Released: 06/10/2000 Document Revised: 09/05/2011 Document Reviewed: 01/28/2008 [facility] Patient Information 2014 [place], [place].  _______________________________________________________________________

## 2013-11-14 ENCOUNTER — Encounter (HOSPITAL_COMMUNITY): Payer: Self-pay

## 2013-11-14 ENCOUNTER — Encounter (INDEPENDENT_AMBULATORY_CARE_PROVIDER_SITE_OTHER): Payer: Self-pay

## 2013-11-14 ENCOUNTER — Encounter (HOSPITAL_COMMUNITY)
Admission: RE | Admit: 2013-11-14 | Discharge: 2013-11-14 | Disposition: A | Discharge status: Home / Self Care | Payer: 59 | Source: Ambulatory Visit | Attending: Orthopedic Surgery | Admitting: Orthopedic Surgery

## 2013-11-14 DIAGNOSIS — Z01812 Encounter for preprocedural laboratory examination: Secondary | ICD-10-CM | POA: Insufficient documentation

## 2013-11-14 LAB — URINALYSIS, ROUTINE W REFLEX MICROSCOPIC
BILIRUBIN URINE: NEGATIVE
GLUCOSE, UA: NEGATIVE mg/dL
KETONES UR: NEGATIVE mg/dL
Leukocytes, UA: NEGATIVE
Nitrite: NEGATIVE
PH: 6 (ref 5.0–8.0)
PROTEIN: NEGATIVE mg/dL
Specific Gravity, Urine: 1.019 (ref 1.005–1.030)
Urobilinogen, UA: 1 mg/dL (ref 0.0–1.0)

## 2013-11-14 LAB — BASIC METABOLIC PANEL
BUN: 8 mg/dL (ref 6–23)
CALCIUM: 9.8 mg/dL (ref 8.4–10.5)
CO2: 26 meq/L (ref 19–32)
CREATININE: 0.86 mg/dL (ref 0.50–1.10)
Chloride: 102 mEq/L (ref 96–112)
GFR calc Af Amer: >90 mL/min (ref >90)
GFR calc non Af Amer: 80 mL/min — ABNORMAL LOW (ref >90)
GLUCOSE: 95 mg/dL (ref 70–99)
Potassium: 4.3 mEq/L (ref 3.7–5.3)
Sodium: 140 mEq/L (ref 137–147)

## 2013-11-14 LAB — URINE MICROSCOPIC-ADD ON

## 2013-11-14 LAB — CBC
HCT: 39.3 % (ref 36.0–46.0)
Hemoglobin: 12.8 g/dL (ref 12.0–15.0)
MCH: 28.6 pg (ref 26.0–34.0)
MCHC: 32.6 g/dL (ref 30.0–36.0)
MCV: 87.9 fL (ref 78.0–100.0)
PLATELETS: 402 10*3/uL — AB (ref 150–400)
RBC: 4.47 MIL/uL (ref 3.87–5.11)
RDW: 12.8 % (ref 11.5–15.5)
WBC: 10 10*3/uL (ref 4.0–10.5)

## 2013-11-14 LAB — PROTIME-INR
INR: 1.06 (ref 0.00–1.49)
Prothrombin Time: 13.6 seconds (ref 11.6–15.2)

## 2013-11-14 LAB — PREGNANCY, URINE: Preg Test, Ur: NEGATIVE

## 2013-11-14 LAB — APTT: aPTT: 33 seconds (ref 24–37)

## 2013-11-14 MED ORDER — CHLORHEXIDINE GLUCONATE 4 % EX LIQD: 60.0000 mL | Freq: Once | CUTANEOUS | Status: DC

## 2013-11-20 NOTE — H&P (Signed)
[name] is an 48 y.o. female.    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  HPI: Pt is a 48 y.o. female complaining of left hip pain.  She is currently only about seven months out from a revision of a left hip from ASR components to a ceramic on polyethylene insert in September 2014. She had presented recently with persistent pain laterally which she had a cortisone injection with mild relief with increased groin pain and desire to return back to use of a cane for ambulation as well as noticable limp. She had not had any limp in the postop period. Based on this a MARS MRI was ordered to evaluate for any complicating features despite being revised to ceramic on polyethylene insert.   MRI findings as reviewed with her showing increasing pseudo tumor, fluid collection over the posterior lateral aspect of the greater trochanter. Some reduction along the iliotibial band though I am unable to fully explain to her the reasoning behind this. It surely can explain a source of her persistent symptoms. If in fact this is a residual metal ion debridement that is resulting in this issue I do feel that it would be important to debride this, perform synovectomy and remove this material as much as possible, wash out the hip, reexamine the hip joint itself with possible exchange of modular components.Exam finds she walks with a slight gluteal limp favoring this left lower extremity. She has tenderness over the lateral side of her hip. Her right hip is normal as it has not been operated on at this point.Would not want to have any long term sequellae or complications to the gluteal musculature periprosthetic bone and surgery is planned.  Risks, benefits and expectations were discussed with the patient.  Risks including but not limited to the risk of anesthesia, blood clots,  nerve damage, blood vessel damage, failure of the prosthesis, infection and up to and including death.  Patient understand the risks, benefits and expectations and wishes to proceed with surgery.    PCP: Kristine Garbe, MD  D/C Plans:  Home with HHPT   Post-op Meds:      No Rx given  Tranexamic Acid:  To be given   Decadron:  To be given   FYI:  ASA post-op   Norco (potential N&V) and tramadol, prn    PMH: Past Medical History  Diagnosis Date  . Thyroid disease     Hypothyroid on replacement therapy  . Headache(784.0) 6 years ago  . Arthritis   . Anemia     "younger days"  . History of blood transfusion sept 2014    1 unit    PSH: Past Surgical History  Procedure Laterality Date  . Joint replacement      Left hip replaced in 2006  . Endometrial ablation    . Cesarean section  1990, 1991  . Tubal ligation    . Total hip revision Left 03/04/2013    Procedure: LEFT TOTAL HIP REVISION ;  Surgeon: Mauri Pole, MD;  Location: [facility] ORS;  Service: Orthopedics;  Laterality: Left;    Social History:  reports that she quit smoking about 13 years ago. Her smoking use included Cigarettes. She smoked 0.00 packs per day for 2 years. She has never used smokeless tobacco. She reports that she does not drink alcohol or use illicit drugs.  Allergies:  Allergies  Allergen  Reactions  . Codeine Shortness Of Breath, Nausea And Vomiting and Palpitations  . Shellfish Allergy Swelling  . Oxycodone Itching and Palpitations    Medications: No current facility-administered medications for this encounter.   Current Outpatient Prescriptions  Medication Sig Dispense Refill  . aspirin EC 81 MG tablet Take 81 mg by mouth every morning.      Marland Kitchen levothyroxine (SYNTHROID, LEVOTHROID) 112 MCG tablet Take 112 mcg by mouth daily before breakfast.        Review of Systems  Constitutional: Negative.   Eyes: Negative.   Respiratory: Negative.   Cardiovascular: Negative.   Gastrointestinal:  Negative.   Genitourinary: Negative.   Musculoskeletal: Positive for joint pain.  Skin: Negative.   Neurological: Positive for headaches.  Endo/Heme/Allergies: Negative.   Psychiatric/Behavioral: Negative.      Physical Exam  Constitutional: She is oriented to person, place, and time. She appears well-developed and well-nourished.  HENT:  Head: Normocephalic and atraumatic.  Mouth/Throat: Oropharynx is clear and moist.  Eyes: Pupils are equal, round, and reactive to light.  Neck: Neck supple.  Cardiovascular: Normal rate, regular rhythm, normal heart sounds and intact distal pulses.   Respiratory: Effort normal and breath sounds normal.  GI: Soft. There is no tenderness. There is no guarding.  Musculoskeletal:       Left hip: She exhibits decreased range of motion, decreased strength, tenderness, bony tenderness, swelling and laceration (healed). She exhibits no crepitus and no deformity.  Neurological: She is alert and oriented to person, place, and time.  Skin: Skin is warm and dry.  Psychiatric: She has a normal mood and affect.    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 Alvan Dame at [facility]. Risks benefits and expectations were discussed with the patient. Patient understand risks, benefits and expectations and wishes to proceed.   [name]   PA-C  11/20/2013, 3:06 PM

## 2013-11-25 ENCOUNTER — Inpatient Hospital Stay (HOSPITAL_COMMUNITY): Payer: 59

## 2013-11-25 ENCOUNTER — Encounter (HOSPITAL_COMMUNITY): Payer: Self-pay | Admitting: *Deleted

## 2013-11-25 ENCOUNTER — Inpatient Hospital Stay (HOSPITAL_COMMUNITY): Payer: 59 | Admitting: Anesthesiology

## 2013-11-25 ENCOUNTER — Encounter (HOSPITAL_COMMUNITY)
Admission: RE | Disposition: A | Discharge status: Home Health | Payer: Self-pay | Source: Ambulatory Visit | Attending: Orthopedic Surgery

## 2013-11-25 ENCOUNTER — Inpatient Hospital Stay (HOSPITAL_COMMUNITY)
Admission: RE | Admit: 2013-11-25 | Discharge: 2013-11-27 | DRG: 464 | Disposition: A | Discharge status: Home Health | Payer: 59 | Source: Ambulatory Visit | Attending: Orthopedic Surgery | Admitting: Orthopedic Surgery

## 2013-11-25 ENCOUNTER — Encounter (HOSPITAL_COMMUNITY): Payer: 59 | Admitting: Anesthesiology

## 2013-11-25 DIAGNOSIS — M25459 Effusion, unspecified hip: Secondary | ICD-10-CM | POA: Diagnosis present

## 2013-11-25 DIAGNOSIS — D62 Acute posthemorrhagic anemia: Secondary | ICD-10-CM | POA: Diagnosis not present

## 2013-11-25 DIAGNOSIS — Z7982 Long term (current) use of aspirin: Secondary | ICD-10-CM

## 2013-11-25 DIAGNOSIS — Z79899 Other long term (current) drug therapy: Secondary | ICD-10-CM

## 2013-11-25 DIAGNOSIS — E669 Obesity, unspecified: Secondary | ICD-10-CM | POA: Diagnosis present

## 2013-11-25 DIAGNOSIS — Z6835 Body mass index (BMI) 35.0-35.9, adult: Secondary | ICD-10-CM

## 2013-11-25 DIAGNOSIS — E039 Hypothyroidism, unspecified: Secondary | ICD-10-CM | POA: Diagnosis present

## 2013-11-25 DIAGNOSIS — Z01812 Encounter for preprocedural laboratory examination: Secondary | ICD-10-CM

## 2013-11-25 DIAGNOSIS — Z96649 Presence of unspecified artificial hip joint: Secondary | ICD-10-CM

## 2013-11-25 DIAGNOSIS — T84099A Other mechanical complication of unspecified internal joint prosthesis, initial encounter: Principal | ICD-10-CM | POA: Diagnosis present

## 2013-11-25 DIAGNOSIS — Y831 Surgical operation with implant of artificial internal device as the cause of abnormal reaction of the patient, or of later complication, without mention of misadventure at the time of the procedure: Secondary | ICD-10-CM | POA: Diagnosis present

## 2013-11-25 DIAGNOSIS — Z87891 Personal history of nicotine dependence: Secondary | ICD-10-CM

## 2013-11-25 HISTORY — PX: TOTAL HIP REVISION: SHX763

## 2013-11-25 LAB — SURGICAL PCR SCREEN
MRSA, PCR: INVALID — AB
Staphylococcus aureus: INVALID — AB

## 2013-11-25 LAB — TYPE AND SCREEN
ABO/RH(D): A POS
Antibody Screen: NEGATIVE

## 2013-11-25 SURGERY — TOTAL HIP REVISION
Anesthesia: General | Site: Hip | Laterality: Left

## 2013-11-25 MED ORDER — CEFAZOLIN SODIUM-DEXTROSE 2-3 GM-% IV SOLR
2.0000 g | INTRAVENOUS | Status: AC
  Administered 2013-11-25: 2 g via INTRAVENOUS

## 2013-11-25 MED ORDER — CEFAZOLIN SODIUM-DEXTROSE 2-3 GM-% IV SOLR
2.0000 g | Freq: Four times a day (QID) | INTRAVENOUS | Status: AC
  Administered 2013-11-25 – 2013-11-26 (×2): 2 g via INTRAVENOUS
  Filled 2013-11-25 (×2): qty 50

## 2013-11-25 MED ORDER — ONDANSETRON HCL 4 MG/2ML IJ SOLN
INTRAMUSCULAR | Status: DC | PRN
  Administered 2013-11-25: 4 mg via INTRAVENOUS

## 2013-11-25 MED ORDER — 0.9 % SODIUM CHLORIDE (POUR BTL) OPTIME
TOPICAL | Status: DC | PRN
  Administered 2013-11-25: 1000 mL

## 2013-11-25 MED ORDER — CEFAZOLIN SODIUM-DEXTROSE 2-3 GM-% IV SOLR
INTRAVENOUS | Status: AC
Start: 2013-11-25 — End: 2013-11-25
  Filled 2013-11-25: qty 50

## 2013-11-25 MED ORDER — LACTATED RINGERS IV SOLN: INTRAVENOUS | Status: DC

## 2013-11-25 MED ORDER — LACTATED RINGERS IV SOLN
INTRAVENOUS | Status: DC | PRN
  Administered 2013-11-25 (×2): via INTRAVENOUS

## 2013-11-25 MED ORDER — METOCLOPRAMIDE HCL 5 MG/ML IJ SOLN: 5.0000 mg | Freq: Three times a day (TID) | INTRAMUSCULAR | Status: DC | PRN

## 2013-11-25 MED ORDER — ALUM & MAG HYDROXIDE-SIMETH 200-200-20 MG/5ML PO SUSP: 30.0000 mL | ORAL | Status: DC | PRN

## 2013-11-25 MED ORDER — ONDANSETRON HCL 4 MG/2ML IJ SOLN: 4.0000 mg | Freq: Four times a day (QID) | INTRAMUSCULAR | Status: DC | PRN

## 2013-11-25 MED ORDER — HYDROCODONE-ACETAMINOPHEN 7.5-325 MG PO TABS
1.0000 | ORAL_TABLET | ORAL | Status: DC | PRN
  Administered 2013-11-25 – 2013-11-27 (×3): 1 via ORAL
  Filled 2013-11-25 (×2): qty 1
  Filled 2013-11-25 (×2): qty 2

## 2013-11-25 MED ORDER — ONDANSETRON HCL 4 MG PO TABS: 4.0000 mg | ORAL_TABLET | Freq: Four times a day (QID) | ORAL | Status: DC | PRN

## 2013-11-25 MED ORDER — LIDOCAINE HCL (CARDIAC) 20 MG/ML IV SOLN
INTRAVENOUS | Status: DC | PRN
  Administered 2013-11-25: 80 mg via INTRAVENOUS

## 2013-11-25 MED ORDER — TRAMADOL HCL 50 MG PO TABS: 50.0000 mg | ORAL_TABLET | Freq: Four times a day (QID) | ORAL | Status: DC | PRN

## 2013-11-25 MED ORDER — DIPHENHYDRAMINE HCL 25 MG PO CAPS: 25.0000 mg | ORAL_CAPSULE | Freq: Four times a day (QID) | ORAL | Status: DC | PRN

## 2013-11-25 MED ORDER — CELECOXIB 200 MG PO CAPS
200.0000 mg | ORAL_CAPSULE | Freq: Two times a day (BID) | ORAL | Status: DC
  Administered 2013-11-25 – 2013-11-27 (×4): 200 mg via ORAL
  Filled 2013-11-25 (×5): qty 1

## 2013-11-25 MED ORDER — PHENOL 1.4 % MT LIQD: 1.0000 | OROMUCOSAL | Status: DC | PRN

## 2013-11-25 MED ORDER — FENTANYL CITRATE 0.05 MG/ML IJ SOLN
INTRAMUSCULAR | Status: AC
  Filled 2013-11-25: qty 5

## 2013-11-25 MED ORDER — PROMETHAZINE HCL 25 MG/ML IJ SOLN
6.2500 mg | INTRAMUSCULAR | Status: DC | PRN
Start: 2013-11-25 — End: 2013-11-25
  Administered 2013-11-25: 12.5 mg via INTRAVENOUS

## 2013-11-25 MED ORDER — DEXAMETHASONE SODIUM PHOSPHATE 10 MG/ML IJ SOLN
10.0000 mg | Freq: Once | INTRAMUSCULAR | Status: AC
  Administered 2013-11-26: 10 mg via INTRAVENOUS
  Filled 2013-11-25: qty 1

## 2013-11-25 MED ORDER — DEXAMETHASONE SODIUM PHOSPHATE 10 MG/ML IJ SOLN
INTRAMUSCULAR | Status: AC
  Filled 2013-11-25: qty 1

## 2013-11-25 MED ORDER — MIDAZOLAM HCL 2 MG/2ML IJ SOLN
INTRAMUSCULAR | Status: AC
  Filled 2013-11-25: qty 2

## 2013-11-25 MED ORDER — DEXAMETHASONE SODIUM PHOSPHATE 10 MG/ML IJ SOLN
10.0000 mg | Freq: Once | INTRAMUSCULAR | Status: AC
  Administered 2013-11-25: 10 mg via INTRAVENOUS

## 2013-11-25 MED ORDER — HYDROMORPHONE HCL PF 1 MG/ML IJ SOLN: 0.5000 mg | INTRAMUSCULAR | Status: DC | PRN

## 2013-11-25 MED ORDER — FERROUS SULFATE 325 (65 FE) MG PO TABS
325.0000 mg | ORAL_TABLET | Freq: Three times a day (TID) | ORAL | Status: DC
  Administered 2013-11-26 – 2013-11-27 (×4): 325 mg via ORAL
  Filled 2013-11-25 (×7): qty 1

## 2013-11-25 MED ORDER — METOCLOPRAMIDE HCL 10 MG PO TABS
5.0000 mg | ORAL_TABLET | Freq: Three times a day (TID) | ORAL | Status: DC | PRN
Start: 2013-11-25 — End: 2013-11-27

## 2013-11-25 MED ORDER — MAGNESIUM CITRATE PO SOLN: 1.0000 | Freq: Once | ORAL | Status: AC | PRN

## 2013-11-25 MED ORDER — PROPOFOL 10 MG/ML IV BOLUS
INTRAVENOUS | Status: AC
  Filled 2013-11-25: qty 20

## 2013-11-25 MED ORDER — DOCUSATE SODIUM 100 MG PO CAPS
100.0000 mg | ORAL_CAPSULE | Freq: Two times a day (BID) | ORAL | Status: DC
  Administered 2013-11-25 – 2013-11-27 (×4): 100 mg via ORAL

## 2013-11-25 MED ORDER — TRANEXAMIC ACID 100 MG/ML IV SOLN
1000.0000 mg | Freq: Once | INTRAVENOUS | Status: AC
  Administered 2013-11-25: 1000 mg via INTRAVENOUS
  Filled 2013-11-25: qty 10

## 2013-11-25 MED ORDER — SODIUM CHLORIDE 0.9 % IV SOLN
100.0000 mL/h | INTRAVENOUS | Status: DC
  Administered 2013-11-25: 100 mL/h via INTRAVENOUS
  Administered 2013-11-26: 20 mL/h via INTRAVENOUS
  Filled 2013-11-25 (×11): qty 1000

## 2013-11-25 MED ORDER — HYDROMORPHONE HCL PF 1 MG/ML IJ SOLN: 0.2500 mg | INTRAMUSCULAR | Status: DC | PRN

## 2013-11-25 MED ORDER — METHOCARBAMOL 1000 MG/10ML IJ SOLN
500.0000 mg | Freq: Four times a day (QID) | INTRAVENOUS | Status: DC | PRN
  Administered 2013-11-25: 500 mg via INTRAVENOUS
  Filled 2013-11-25: qty 5

## 2013-11-25 MED ORDER — HYDROMORPHONE HCL PF 1 MG/ML IJ SOLN
INTRAMUSCULAR | Status: DC | PRN
  Administered 2013-11-25: 2 mg via INTRAVENOUS

## 2013-11-25 MED ORDER — SUCCINYLCHOLINE CHLORIDE 20 MG/ML IJ SOLN
INTRAMUSCULAR | Status: DC | PRN
  Administered 2013-11-25: 100 mg via INTRAVENOUS

## 2013-11-25 MED ORDER — BISACODYL 10 MG RE SUPP: 10.0000 mg | Freq: Every day | RECTAL | Status: DC | PRN

## 2013-11-25 MED ORDER — POLYETHYLENE GLYCOL 3350 17 G PO PACK
17.0000 g | PACK | Freq: Two times a day (BID) | ORAL | Status: DC
  Administered 2013-11-25 – 2013-11-27 (×3): 17 g via ORAL

## 2013-11-25 MED ORDER — MIDAZOLAM HCL 5 MG/5ML IJ SOLN
INTRAMUSCULAR | Status: DC | PRN
  Administered 2013-11-25: 2 mg via INTRAVENOUS

## 2013-11-25 MED ORDER — HYDROMORPHONE HCL PF 2 MG/ML IJ SOLN
INTRAMUSCULAR | Status: AC
  Filled 2013-11-25: qty 1

## 2013-11-25 MED ORDER — FENTANYL CITRATE 0.05 MG/ML IJ SOLN
INTRAMUSCULAR | Status: DC | PRN
  Administered 2013-11-25: 100 ug via INTRAVENOUS
  Administered 2013-11-25: 50 ug via INTRAVENOUS
  Administered 2013-11-25 (×2): 100 ug via INTRAVENOUS

## 2013-11-25 MED ORDER — LEVOTHYROXINE SODIUM 112 MCG PO TABS
112.0000 ug | ORAL_TABLET | Freq: Every day | ORAL | Status: DC
  Administered 2013-11-26 – 2013-11-27 (×2): 112 ug via ORAL
  Filled 2013-11-25 (×3): qty 1

## 2013-11-25 MED ORDER — METHOCARBAMOL 500 MG PO TABS
500.0000 mg | ORAL_TABLET | Freq: Four times a day (QID) | ORAL | Status: DC | PRN
  Filled 2013-11-25 (×2): qty 1

## 2013-11-25 MED ORDER — LIDOCAINE HCL (CARDIAC) 20 MG/ML IV SOLN
INTRAVENOUS | Status: AC
  Filled 2013-11-25: qty 5

## 2013-11-25 MED ORDER — ASPIRIN EC 325 MG PO TBEC
325.0000 mg | DELAYED_RELEASE_TABLET | Freq: Two times a day (BID) | ORAL | Status: DC
  Administered 2013-11-26 – 2013-11-27 (×3): 325 mg via ORAL
  Filled 2013-11-25 (×5): qty 1

## 2013-11-25 MED ORDER — MENTHOL 3 MG MT LOZG
1.0000 | LOZENGE | OROMUCOSAL | Status: DC | PRN
  Filled 2013-11-25: qty 9

## 2013-11-25 MED ORDER — LACTATED RINGERS IV SOLN
INTRAVENOUS | Status: DC
  Administered 2013-11-25: 1000 mL via INTRAVENOUS

## 2013-11-25 MED ORDER — PROMETHAZINE HCL 25 MG/ML IJ SOLN
INTRAMUSCULAR | Status: AC
  Filled 2013-11-25: qty 1

## 2013-11-25 MED ORDER — PROPOFOL 10 MG/ML IV BOLUS
INTRAVENOUS | Status: DC | PRN
  Administered 2013-11-25: 200 mg via INTRAVENOUS

## 2013-11-25 SURGICAL SUPPLY — 61 items
ADH SKN CLS APL DERMABOND .7 (GAUZE/BANDAGES/DRESSINGS) ×2
BAG SPEC THK2 15X12 ZIP CLS (MISCELLANEOUS) ×1
BAG ZIPLOCK 12X15 (MISCELLANEOUS) ×2 IMPLANT
BLADE SAW SGTL 18X1.27X75 (BLADE) ×2 IMPLANT
BRUSH FEMORAL CANAL (MISCELLANEOUS) IMPLANT
CUP ACETAB PIN MULTI 54MM (Orthopedic Implant) ×2 IMPLANT
DERMABOND ADVANCED (GAUZE/BANDAGES/DRESSINGS) ×2
DERMABOND ADVANCED .7 DNX12 (GAUZE/BANDAGES/DRESSINGS) ×1 IMPLANT
DRAPE INCISE IOBAN 85X60 (DRAPES) ×2 IMPLANT
DRAPE ORTHO SPLIT 77X108 STRL (DRAPES) ×4
DRAPE POUCH INSTRU U-SHP 10X18 (DRAPES) ×2 IMPLANT
DRAPE SURG 17X11 SM STRL (DRAPES) ×2 IMPLANT
DRAPE SURG ORHT 6 SPLT 77X108 (DRAPES) ×2 IMPLANT
DRAPE U-SHAPE 47X51 STRL (DRAPES) ×2 IMPLANT
DRSG AQUACEL AG ADV 3.5X10 (GAUZE/BANDAGES/DRESSINGS) IMPLANT
DRSG AQUACEL AG ADV 3.5X14 (GAUZE/BANDAGES/DRESSINGS) ×3 IMPLANT
DRSG EMULSION OIL 3X16 NADH (GAUZE/BANDAGES/DRESSINGS) ×2 IMPLANT
DRSG MEPILEX BORDER 4X4 (GAUZE/BANDAGES/DRESSINGS) ×2 IMPLANT
DRSG MEPILEX BORDER 4X8 (GAUZE/BANDAGES/DRESSINGS) ×2 IMPLANT
DRSG TEGADERM 4X4.75 (GAUZE/BANDAGES/DRESSINGS) ×2 IMPLANT
DURAPREP 26ML APPLICATOR (WOUND CARE) ×2 IMPLANT
ELECT BLADE TIP CTD 4 INCH (ELECTRODE) ×2 IMPLANT
ELECT REM PT RETURN 9FT ADLT (ELECTROSURGICAL) ×2
ELECTRODE REM PT RTRN 9FT ADLT (ELECTROSURGICAL) ×1 IMPLANT
EVACUATOR 1/8 PVC DRAIN (DRAIN) ×2 IMPLANT
FACESHIELD WRAPAROUND (MASK) ×8 IMPLANT
FACESHIELD WRAPAROUND OR TEAM (MASK) ×4 IMPLANT
GAUZE SPONGE 2X2 8PLY STRL LF (GAUZE/BANDAGES/DRESSINGS) ×1 IMPLANT
GLOVE BIOGEL PI IND STRL 7.5 (GLOVE) ×1 IMPLANT
GLOVE BIOGEL PI IND STRL 8 (GLOVE) ×1 IMPLANT
GLOVE BIOGEL PI INDICATOR 7.5 (GLOVE) ×1
GLOVE BIOGEL PI INDICATOR 8 (GLOVE) ×1
GLOVE ECLIPSE 8.0 STRL XLNG CF (GLOVE) ×4 IMPLANT
GOWN SPEC L3 XXLG W/TWL (GOWN DISPOSABLE) ×4 IMPLANT
GOWN STRL REUS W/TWL LRG LVL3 (GOWN DISPOSABLE) ×2 IMPLANT
HANDPIECE INTERPULSE COAX TIP (DISPOSABLE)
HEAD FEM DLT TS CER 36X+5.0 (Head) ×1 IMPLANT
KIT BASIN OR (CUSTOM PROCEDURE TRAY) ×2 IMPLANT
LINER NEUTRAL 52X36X54 PLUS 4 (Liner) ×1 IMPLANT
MANIFOLD NEPTUNE II (INSTRUMENTS) ×2 IMPLANT
NS IRRIG 1000ML POUR BTL (IV SOLUTION) ×4 IMPLANT
PACK TOTAL JOINT (CUSTOM PROCEDURE TRAY) ×2 IMPLANT
POSITIONER SURGICAL ARM (MISCELLANEOUS) ×2 IMPLANT
PRESSURIZER FEMORAL UNIV (MISCELLANEOUS) IMPLANT
SCREW 6.5MMX25MM (Screw) ×2 IMPLANT
SCREW 6.5MMX30MM (Screw) ×2 IMPLANT
SET HNDPC FAN SPRY TIP SCT (DISPOSABLE) IMPLANT
SPONGE GAUZE 2X2 STER 10/PKG (GAUZE/BANDAGES/DRESSINGS) ×1
SPONGE LAP 18X18 X RAY DECT (DISPOSABLE) ×2 IMPLANT
SPONGE LAP 4X18 X RAY DECT (DISPOSABLE) ×2 IMPLANT
STAPLER VISISTAT 35W (STAPLE) ×2 IMPLANT
SUCTION FRAZIER TIP 10 FR DISP (SUCTIONS) ×2 IMPLANT
SUT MNCRL AB 3-0 PS2 18 (SUTURE) ×1 IMPLANT
SUT VIC AB 1 CT1 36 (SUTURE) ×6 IMPLANT
SUT VIC AB 2-0 CT1 27 (SUTURE) ×6
SUT VIC AB 2-0 CT1 TAPERPNT 27 (SUTURE) ×3 IMPLANT
SUT VLOC 180 0 24IN GS25 (SUTURE) ×4 IMPLANT
TOWEL OR 17X26 10 PK STRL BLUE (TOWEL DISPOSABLE) ×4 IMPLANT
TOWER CARTRIDGE SMART MIX (DISPOSABLE) IMPLANT
TRAY FOLEY CATH 14FRSI W/METER (CATHETERS) ×2 IMPLANT
WATER STERILE IRR 1500ML POUR (IV SOLUTION) ×2 IMPLANT

## 2013-11-25 NOTE — Anesthesia Preprocedure Evaluation (Addendum)
Anesthesia Evaluation  Patient identified by MRN, date of birth, ID band Patient awake    Reviewed: Allergy & Precautions, H&P , NPO status , Patient's Chart, lab work & pertinent test results  Airway Mallampati: II TM Distance: >3 FB Neck ROM: full    Dental  (+) Edentulous Upper, Dental Advisory Given   Pulmonary neg pulmonary ROS, former smoker,  breath sounds clear to auscultation  Pulmonary exam normal       Cardiovascular Exercise Tolerance: Good negative cardio ROS  Rhythm:regular Rate:Normal     Neuro/Psych  Headaches, negative neurological ROS  negative psych ROS   GI/Hepatic negative GI ROS, Neg liver ROS,   Endo/Other  negative endocrine ROSHypothyroidism   Renal/GU negative Renal ROS  negative genitourinary   Musculoskeletal   Abdominal   Peds  Hematology negative hematology ROS (+)   Anesthesia Other Findings   Reproductive/Obstetrics negative OB ROS                          Anesthesia Physical Anesthesia Plan  ASA: II  Anesthesia Plan: General   Post-op Pain Management:    Induction: Intravenous  Airway Management Planned: Oral ETT  Additional Equipment:   Intra-op Plan:   Post-operative Plan: Extubation in OR  Informed Consent: I have reviewed the patients History and Physical, chart, labs and discussed the procedure including the risks, benefits and alternatives for the proposed anesthesia with the patient or authorized representative who has indicated his/her understanding and acceptance.   Dental Advisory Given  Plan Discussed with: CRNA and Surgeon  Anesthesia Plan Comments:         Anesthesia Quick Evaluation

## 2013-11-25 NOTE — Anesthesia Postprocedure Evaluation (Signed)
  Anesthesia Post-op Note  Patient: [name]  Procedure(s) Performed: Procedure(s) (LRB): LEFT HIP EXCISIONAL AND NON EXCISIONAL DEBRIDEMENT ACESS LEFT HIP COMPONENT  (Left)  Patient Location: PACU  Anesthesia Type: General  Level of Consciousness: awake and alert   Airway and Oxygen Therapy: Patient Spontanous Breathing  Post-op Pain: mild  Post-op Assessment: Post-op Vital signs reviewed, Patient's Cardiovascular Status Stable, Respiratory Function Stable, Patent Airway and No signs of Nausea or vomiting  Last Vitals:  Filed Vitals:   11/25/13 1700  BP: 123/78  Pulse: 78  Temp: 36.5 C  Resp: 12    Post-op Vital Signs: stable   Complications: No apparent anesthesia complications

## 2013-11-25 NOTE — Brief Op Note (Signed)
11/25/2013  4:01 PM  PATIENT:  [name]  48 y.o. 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   PROCEDURE:  Procedure(s): Revision Left Total hip replacement with excisional debridement  SURGEON:  Surgeon(s) and Role:    * Mauri Pole, MD - Primary  PHYSICIAN ASSISTANT: Danae Orleans, PA-C  ANESTHESIA:   general  EBL:  Total I/O In: 1000 [I.V.:1000] Out: 850 [Urine:150; Blood:700]  BLOOD ADMINISTERED:none  DRAINS: none   LOCAL MEDICATIONS USED:  NONE  SPECIMEN:  Source of Specimen:  left hip components sent to pathology for inspection  DISPOSITION OF SPECIMEN:  PATHOLOGY  COUNTS:  YES  TOURNIQUET:  * No tourniquets in log *  DICTATION: .Other Dictation: Dictation Number [id number]  PLAN OF CARE: Admit to inpatient   PATIENT DISPOSITION:  PACU - hemodynamically stable.   Delay start of Pharmacological VTE agent (>24hrs) due to surgical blood loss or risk of bleeding: no

## 2013-11-25 NOTE — Transfer of Care (Signed)
Immediate Anesthesia Transfer of Care Note  Patient: [name]  Procedure(s) Performed: Procedure(s): LEFT HIP EXCISIONAL AND NON EXCISIONAL DEBRIDEMENT ACESS LEFT HIP COMPONENT  (Left)  Patient Location: PACU  Anesthesia Type:General  Level of Consciousness: awake and alert   Airway & Oxygen Therapy: Patient Spontanous Breathing and Patient connected to face mask oxygen  Post-op Assessment: Report given to PACU RN and Post -op Vital signs reviewed and stable  Post vital signs: Reviewed and stable  Complications: No apparent anesthesia complications

## 2013-11-25 NOTE — Interval H&P Note (Signed)
History and Physical Interval Note:  11/25/2013 1:10 PM  [name]  has presented today for surgery, with the diagnosis of STATUS POST LEFT TOTAL HIP FOR RESIDUAL FOR RECURRENT PSUEDO TUMOR  The various methods of treatment have been discussed with the patient and family. After consideration of risks, benefits and other options for treatment, the patient has consented to  Procedure(s): LEFT HIP EXCISIONAL AND NON EXCISIONAL DEBRIDEMENT ACESS LEFT HIP COMPONENT  (Left) as a surgical intervention .  The patient's history has been reviewed, patient examined, no change in status, stable for surgery.  I have reviewed the patient's chart and labs.  Questions were answered to the patient's satisfaction.     Mauri Pole

## 2013-11-26 ENCOUNTER — Encounter (HOSPITAL_COMMUNITY): Payer: Self-pay | Admitting: Orthopedic Surgery

## 2013-11-26 LAB — BASIC METABOLIC PANEL
BUN: 8 mg/dL (ref 6–23)
CO2: 26 mEq/L (ref 19–32)
Calcium: 8.7 mg/dL (ref 8.4–10.5)
Chloride: 102 mEq/L (ref 96–112)
Creatinine, Ser: 0.81 mg/dL (ref 0.50–1.10)
GFR, EST NON AFRICAN AMERICAN: 86 mL/min — AB (ref >90)
GLUCOSE: 152 mg/dL — AB (ref 70–99)
Potassium: 4.2 mEq/L (ref 3.7–5.3)
Sodium: 137 mEq/L (ref 137–147)

## 2013-11-26 LAB — CBC
HEMATOCRIT: 31 % — AB (ref 36.0–46.0)
Hemoglobin: 10 g/dL — ABNORMAL LOW (ref 12.0–15.0)
MCH: 28.3 pg (ref 26.0–34.0)
MCHC: 32.3 g/dL (ref 30.0–36.0)
MCV: 87.8 fL (ref 78.0–100.0)
Platelets: 367 10*3/uL (ref 150–400)
RBC: 3.53 MIL/uL — ABNORMAL LOW (ref 3.87–5.11)
RDW: 12.9 % (ref 11.5–15.5)
WBC: 14.7 10*3/uL — ABNORMAL HIGH (ref 4.0–10.5)

## 2013-11-26 NOTE — Progress Notes (Signed)
Patient ID: [name], female   DOB: 1966-06-06, 48 y.o.   MRN: [id number] Subjective: 1 Day Post-Op Procedure(s) (LRB): Revision left THR    Patient reports pain as mild but admittedly has not done much activity by time seen this am  Objective:   VITALS:   Filed Vitals:   11/26/13 1127  BP:   Pulse:   Temp:   Resp: 16    Neurovascular intact Incision: dressing C/D/I  LABS  Recent Labs  11/26/13 0527  HGB 10.0*  HCT 31.0*  WBC 14.7*  PLT 367     Recent Labs  11/26/13 0527  NA 137  K 4.2  BUN 8  CREATININE 0.81  GLUCOSE 152*    No results found for this basename: LABPT, INR,  in the last 72 hours   Assessment/Plan: 1 Day Post-Op Procedure(s) (LRB): LEFT HIP EXCISIONAL AND NON EXCISIONAL DEBRIDEMENT ACESS LEFT HIP COMPONENT  (Left)   Advance diet Up with therapy Discharge home with home health this pm or tomorrow depending on progress with therapy

## 2013-11-26 NOTE — Progress Notes (Signed)
11/26/13 1500  PT Visit Information  Last PT Received On 11/26/13  Assistance Needed +1  History of Present Illness Pt is a 48 year old female s/p L THA revision  PT Time Calculation  PT Start Time 1439  PT Stop Time 1502  PT Time Calculation (min) 23 min  Subjective Data  Subjective Pt performed exercises in bed and then ambulated in hallway.  Reviewed posterior hip precautions and PWB status as well.  Pt plans to d/c home tomorrow.  Precautions  Precautions Posterior Hip  Restrictions  Weight Bearing Restrictions Yes  LLE Weight Bearing PWB  Cognition  Arousal/Alertness Awake/alert  Behavior During Therapy WFL for tasks assessed/performed  Overall Cognitive Status Within Functional Limits for tasks assessed  Bed Mobility  Overal bed mobility Modified Independent  General bed mobility comments pt self assisted L LE  Transfers  Overall transfer level Needs assistance  Equipment used Rolling walker (2 wheeled)  Transfers Sit to/from Stand  Sit to Stand Supervision  General transfer comment verbal cues for safe technique  Ambulation/Gait  Ambulation/Gait assistance Supervision  Ambulation Distance (Feet) 100 Feet  Assistive device Rolling walker (2 wheeled)  Gait Pattern/deviations Step-through pattern;Antalgic  General Gait Details pt able to tolerate ambulation better this afternoon, verbal cues for turning within precautions  Exercises  Exercises Total Joint  Total Joint Exercises  Ankle Circles/Pumps AROM;Both;15 reps  Quad Sets AROM;Both;15 reps  Gluteal Sets AROM;Both;15 reps  Short Arc Quad AROM;Left;15 reps  Heel Slides AROM;Left;15 reps (all exercises within precautions)  Hip ABduction/ADduction AROM;Left;15 reps  PT - End of Session  Activity Tolerance Patient tolerated treatment well  Patient left in bed;with call bell/phone within reach;with family/visitor present  PT - Assessment/Plan  PT Plan Current plan remains appropriate  PT Frequency 7X/week   Follow Up Recommendations Home health PT  PT equipment Rolling walker with 5" wheels  PT Goal Progression  Progress towards PT goals Progressing toward goals  PT General Charges  $$ ACUTE PT VISIT 1 Procedure  PT Treatments  $Gait Training 8-22 mins  $Therapeutic Exercise 8-22 mins   [name], PT, DPT 11/26/2013 Pager: [phone]

## 2013-11-26 NOTE — Evaluation (Signed)
Physical Therapy Evaluation Patient Details Name: [name] MRN: [id number] DOB: [birth year] Today's Date: 11/26/2013   History of Present Illness  Pt is a 48 year old female s/p L THA revision  Clinical Impression  Pt is s/p L THA revision resulting in the deficits listed below (see PT Problem List).  Pt will benefit from skilled PT to increase their independence and safety with mobility to allow discharge to the venue listed below.  Pt mobilizing very well POD #1 however limited only by nausea this morning.  Reviewed posterior hip precautions and PWB with pt as well.     Follow Up Recommendations Home health PT    Equipment Recommendations  Rolling walker with 5" wheels    Recommendations for Other Services       Precautions / Restrictions Precautions Precautions: Posterior Hip Precaution Comments: pt able to verbalize all posterior hip precautions Restrictions Weight Bearing Restrictions: Yes LLE Weight Bearing: Partial weight bearing      Mobility  Bed Mobility               General bed mobility comments: pt in chair  Transfers Overall transfer level: Needs assistance Equipment used: Rolling walker (2 wheeled) Transfers: Sit to/from Stand Sit to Stand: Min guard;Supervision         General transfer comment: verbal cues for safe technique  Ambulation/Gait Ambulation/Gait assistance: Min guard;Supervision Ambulation Distance (Feet): 200 Feet Assistive device: Rolling walker (2 wheeled) Gait Pattern/deviations: Step-through pattern;Antalgic     General Gait Details: verbal cues for PWB, pt able to recall good sequence and heel to toe, pt limited by nausea  Stairs            Wheelchair Mobility    Modified Rankin (Stroke Patients Only)       Balance                                             Pertinent Vitals/Pain Reports mild pain L hip, activity to tolerance    Home Living Family/patient expects to be  discharged to:: Private residence Living Arrangements: Spouse/significant other Available Help at Discharge: Family Type of Home: House Home Access: Stairs to enter Entrance Stairs-Rails: None Technical brewer of Steps: 3 Home Layout: One level Home Equipment: Cane - single point Additional Comments: pt threw away RW from september hoping to not need it    Prior Function Level of Independence: Independent               Hand Dominance        Extremity/Trunk Assessment   Upper Extremity Assessment: Overall WFL for tasks assessed           Lower Extremity Assessment: LLE deficits/detail   LLE Deficits / Details: functional hip weakness observed     Communication   Communication: No difficulties  Cognition Arousal/Alertness: Awake/alert Behavior During Therapy: WFL for tasks assessed/performed Overall Cognitive Status: Within Functional Limits for tasks assessed                      General Comments      Exercises        Assessment/Plan    PT Assessment Patient needs continued PT services  PT Diagnosis Difficulty walking;Acute pain   PT Problem List Decreased strength;Decreased activity tolerance;Decreased mobility;Pain  PT Treatment Interventions Stair training;Gait training;DME instruction;Functional mobility training;Patient/family education;Therapeutic exercise;Therapeutic activities  PT Goals (Current goals can be found in the Care Plan section) Acute Rehab PT Goals Patient Stated Goal: never do this again PT Goal Formulation: With patient Time For Goal Achievement: 11/29/13 Potential to Achieve Goals: Good    Frequency 7X/week   Barriers to discharge        Co-evaluation               End of Session   Activity Tolerance: Other (comment) (limited by nausea) Patient left: in chair;with call bell/phone within reach           Time: 1052-1113 PT Time Calculation (min): 21 min   Charges:   PT Evaluation $Initial  PT Evaluation Tier I: 1 Procedure PT Treatments $Gait Training: 8-22 mins   PT G CodesJunius Argyle 11/26/2013, 1:32 PM Carmelia Bake, PT, DPT 11/26/2013 Pager: [phone]

## 2013-11-26 NOTE — Progress Notes (Signed)
CARE MANAGEMENT NOTE 11/26/2013  Patient:  KENSEY, LUEPKE   Account Number:  [id number]  Date Initiated:  11/26/2013  Documentation initiated by:  Garrin Kirwan  Subjective/Objective Assessment:   total hip revision     Action/Plan:   will follow for needs   Anticipated DC Date:  11/29/2013   Anticipated DC Plan:  [place] referral  NA      DC Planning Services  CM consult      [facility] Choice  NA   Choice offered to / List presented to:  C-1 Patient   DME arranged  NA      DME agency  NA     [place] arranged  [place]   Status of service:  In process, will continue to follow Medicare Important Message given?  NA - LOS <3 / Initial given by admissions (If response is "NO", the following Medicare IM given date fields will be blank) Date Medicare IM given:   Date Additional Medicare IM given:    Discharge Disposition:    Per UR Regulation:  Reviewed for med. necessity/level of care/duration of stay  If discussed at [place] of Stay Meetings, dates discussed:    Comments:  [id number]/Breyer Tejera Eldridge Dace, [place], [place] [phone] Chart Reviewed for discharge and hospital needs. Discharge needs at time of review: None present will follow for needs. Review of patient progress due on [id number]

## 2013-11-26 NOTE — Op Note (Signed)
[name], [name] NO.:  [id number]  MEDICAL RECORD NO.:  [id number]  LOCATION:  38                         FACILITY:  [facility]  PHYSICIAN:  [name], M.D.  DATE OF BIRTH:  1965/07/21  DATE OF PROCEDURE:  11/25/2013 DATE OF DISCHARGE:                              OPERATIVE REPORT   PREOPERATIVE DIAGNOSIS:  Failed left total hip replacement with findings by radiographic workup of residual pseudotumor fluid collection around her hip with persistent pain.  POSTOPERATIVE DIAGNOSIS:  Failed left total hip replacement with findings by radiographic workup of residual pseudotumor fluid collection around her hip with persistent pain.  PROCEDURE: 1. Revision of left total hip arthroplasty with excisional debridement     of skin and pseudocapsular tissue noted in the subcutaneous layer     down to the iliotibial band and gluteal fascia over the entire     extent for 12-inch incision sharply with a scalpel. 2. Revision of left total hip arthroplasty with a 54 Gription multi-     hole cup, used 3 cancellous screws, and a 36+ 4 10-degree face     changing liner with a 36+ 5 Delta ceramic ball with a TS sleeve.  SURGEON:  [name], M.D.  ASSISTANT:  [name].  Note that Mr. [name] was present for the entirety of the case for preoperative positioning, perioperative management, operative extremity, general facilitation of the case, primary wound closure.  ANESTHESIA:  General.  SPECIMENS:  The components were sent to Pathology per her request from an attorney.  DRAINS:  None.  BLOOD LOSS:  About 700 mL.  FLUIDS:  No blood given.  INDICATION FOR PROCEDURE:  [name] is a 48 year old female with a history of a left total hip arthroplasty in the latter 2008 about.  This was done with an ASR hip.  She was subsequently revised by myself in September 2014 to a ceramic on polyethylene insert component.  She had presented with persistent  pain laterally.  Residual MRI revealed findings that were consistent with a pseudotumor per the read.  Based on the persistence of her pain, she wished to proceed with surgical intervention.  We discussed the risks of infection for that going in with the third time for any surgery, risks of dislocation, need for future revision surgery.  Consent was obtained for benefit of pain relief.  PROCEDURE IN DETAIL:  The patient was brought to the operative theater. Once adequate anesthesia, preoperative antibiotics, Ancef administered, she was positioned into the right lateral decubitus position with left side up.  Left lower extremity was then prepped and draped in sterile fashion.  Time-out was performed identifying the patient, planned procedure, and extremity.  The patient's old incision was utilized.  I excised the skin sharply. Upon entry into this area, we found a pseudocapsular and capsulated area of fluid.  There were no signs of infection.  This was ceruminous type fluid, this was palpable prior to the surgery as indicated were performed.  I spent time at this point excising this entire pseudocapsular layer trying to allow for adequate healing to this adipose layer.  She was noted to have about 4 inch of adipose  layer down to the iliotibial band and gluteal fascia.  Upon exposure here, the iliotibial band and gluteal fascia were incised for posterior approach to the hip.  I then exposed the posterior aspect of the hip carefully, encountered normal synovial fluid.  No signs of any metal staining. Upon exposure and debridement of posterior aspect of the hip, the hip was dislocated.  I removed the old femoral head.  I cleared the ileum proximally and was able to place the trunnion onto the ilium.  I made incision based on the appearance of her radiographs as well as the persistence of her discomfort to revise the acetabulum.  My goals were to change the orientation of the cup to make  it flatter with left abduction as well as changed anteversion.  Upon utilizing a 6.5 cancellous screw, I then removed the old polyethylene liner, 2 previous cancellous screws removed.  Then, using the explant system, we were able to remove the acetabular component without bone loss.  Based on remaining bone stock in the appearance of the bone, I elected to go back on the same size component size 54 Gription cup was then impacted at this point, what appeared to be in about 40 degrees of abduction based on the hip guide and forward flexed less than what the old cup was at about 20 degrees.  I was able to place 3 cancellous screws which all felt that these provide good initial fixation to the cup, I then did a trial reduction with a 36 +4 10-degree face changing liner position on this left hip at about 2 o'clock or 3 o'clock.  We did a trial reduction with a 36 +5 ball, this we had previously removed.  The hip was reduced and found to be stable with a combined anteversion using this lipped liner 45-50 degrees.  There was no evidence of any impingement or subluxation, leg lengths remained the same.  Given these findings, the trial components were removed.  The final 36 +4 10-degree face changing liner was placed, I did place a hole limiter prior to this.  We then placed and impacted into position a 36+ 5 Delta ceramic ball TS liner.  The hip was then reduced.  We had irrigated the hip throughout the case and again at this point.  Based on the dissection down to the hip joint, there was no capsular tissue to reapproximate.  I thus reapproximated the iliotibial band and gluteal fascia using a combination of #1 Vicryl and 0 V-Loc sutures.  The remaining wound was closed with 2-0 Vicryl and a running 3-0 Monocryl. The hip was cleaned, dried, and dressed sterilely using an Aquacel dressing.  She was then brought to the recovery room in stable condition tolerating the procedure well.  Findings  were reviewed with the family. She will be partial weightbearing to allow for initial healing of the acetabular component with posterior hip precautions.     Brittney Cassis Alvan [name], M.D.     MDO/MEDQ  D:  11/25/2013  T:  11/26/2013  Job:  [id number]

## 2013-11-26 NOTE — Evaluation (Signed)
Occupational Therapy Evaluation Patient Details Name: MIKU UDALL MRN: [id number] DOB: 02-Dec-1965 Today's Date: 11/26/2013    History of Present Illness LTHA THA   Clinical Impression   Pt presents to OT with decreased I with ADL activity and will benefit from skilled OT to increase Iwith ADL activity and return to PLOF    Follow Up Recommendations  Home health OT    Equipment Recommendations  None recommended by OT       Precautions / Restrictions Precautions Precautions: Posterior Hip Restrictions Weight Bearing Restrictions: Yes LLE Weight Bearing: Partial weight bearing      Mobility Bed Mobility               General bed mobility comments: pt in chair  Transfers                           ADL Overall ADL's : Needs assistance/impaired     Grooming: Set up;Sitting   Upper Body Bathing: Set up;Sitting   Lower Body Bathing: Minimal assistance;Sit to/from stand   Upper Body Dressing : Set up;Sitting   Lower Body Dressing: Minimal assistance;Sit to/from stand   Toilet Transfer: Minimal assistance;Adhering to hip precautions;Cueing for safety   Toileting- Clothing Manipulation and Hygiene: Minimal assistance;Sit to/from stand;Adhering to hip precautions         General ADL Comments: Pt performing bathing and dressing with OT in which OT did educate pt during entire session     Vision                            Extremity/Trunk Assessment Upper Extremity Assessment Upper Extremity Assessment: Overall WFL for tasks assessed           Communication Communication Communication: No difficulties   Cognition Arousal/Alertness: Awake/alert Behavior During Therapy: WFL for tasks assessed/performed Overall Cognitive Status: Within Functional Limits for tasks assessed                                Home Living Family/patient expects to be discharged to:: Private residence Living Arrangements:  Spouse/significant other Available Help at Discharge: Family Type of Home: House Home Access: Stairs to enter Technical brewer of Steps: 3   Home Layout: One level     Bathroom Shower/Tub: Teacher, early years/pre: [place]: [employer] - single point   Additional Comments: pt threw away one from september hoping to not need it      Prior Functioning/Environment Level of Independence: Independent             OT Diagnosis: Generalized weakness   OT Problem List: Decreased strength;Decreased knowledge of use of DME or AE;Decreased knowledge of precautions   OT Treatment/Interventions: Self-care/ADL training;DME and/or AE instruction    OT Goals(Current goals can be found in the care plan section) Acute Rehab OT Goals Patient Stated Goal: never do this again OT Goal Formulation: With patient Time For Goal Achievement: 12/10/13 Potential to Achieve Goals: Good  OT Frequency: Min 2X/week              End of Session    Activity Tolerance: Patient tolerated treatment well Patient left: in chair;with call bell/phone within reach   Time: 1010-1035 OT Time Calculation (min): 25 min Charges:  OT General Charges $OT Visit: 1 Procedure OT Evaluation $Initial OT Evaluation  Tier I: 1 Procedure OT Treatments $Self Care/Home Management : 23-37 mins G-Codes:    Betsy Pries December 11, 2013, 10:43 AM

## 2013-11-27 LAB — CBC
HCT: 28.6 % — ABNORMAL LOW (ref 36.0–46.0)
Hemoglobin: 9 g/dL — ABNORMAL LOW (ref 12.0–15.0)
MCH: 27.6 pg (ref 26.0–34.0)
MCHC: 31.5 g/dL (ref 30.0–36.0)
MCV: 87.7 fL (ref 78.0–100.0)
PLATELETS: 365 10*3/uL (ref 150–400)
RBC: 3.26 MIL/uL — ABNORMAL LOW (ref 3.87–5.11)
RDW: 13 % (ref 11.5–15.5)
WBC: 13.4 10*3/uL — ABNORMAL HIGH (ref 4.0–10.5)

## 2013-11-27 LAB — BASIC METABOLIC PANEL
BUN: 9 mg/dL (ref 6–23)
CO2: 26 mEq/L (ref 19–32)
Calcium: 8.9 mg/dL (ref 8.4–10.5)
Chloride: 103 mEq/L (ref 96–112)
Creatinine, Ser: 0.74 mg/dL (ref 0.50–1.10)
GFR calc Af Amer: >90 mL/min (ref >90)
Glucose, Bld: 134 mg/dL — ABNORMAL HIGH (ref 70–99)
POTASSIUM: 4 meq/L (ref 3.7–5.3)
SODIUM: 138 meq/L (ref 137–147)

## 2013-11-27 LAB — MRSA CULTURE

## 2013-11-27 MED ORDER — FERROUS SULFATE 325 (65 FE) MG PO TABS: 325.0000 mg | ORAL_TABLET | Freq: Three times a day (TID) | ORAL | Status: DC

## 2013-11-27 MED ORDER — METHOCARBAMOL 500 MG PO TABS: 500.0000 mg | ORAL_TABLET | Freq: Four times a day (QID) | ORAL | Status: DC | PRN

## 2013-11-27 MED ORDER — DSS 100 MG PO CAPS: 100.0000 mg | ORAL_CAPSULE | Freq: Two times a day (BID) | ORAL | Status: DC

## 2013-11-27 MED ORDER — POLYETHYLENE GLYCOL 3350 17 G PO PACK: 17.0000 g | PACK | Freq: Two times a day (BID) | ORAL | Status: DC

## 2013-11-27 MED ORDER — ASPIRIN 325 MG PO TBEC: 325.0000 mg | DELAYED_RELEASE_TABLET | Freq: Two times a day (BID) | ORAL | Status: DC

## 2013-11-27 MED ORDER — HYDROCODONE-ACETAMINOPHEN 7.5-325 MG PO TABS: 1.0000 | ORAL_TABLET | ORAL | Status: DC | PRN

## 2013-11-27 NOTE — Progress Notes (Signed)
   Subjective: 2 Days Post-Op Procedure(s) (LRB): LEFT HIP EXCISIONAL AND NON EXCISIONAL DEBRIDEMENT ACESS LEFT HIP COMPONENT  (Left)   Patient reports pain as mild, pain controlled. No events throughout the night. Denies any CP or SOB. Ready to be discharged home.  Objective:   VITALS:   Filed Vitals:   11/27/13 0510  BP: 107/69  Pulse: 76  Temp: 97.7 F (36.5 C)  Resp: 16    Neurovascular intact Dorsiflexion/Plantar flexion intact Incision: dressing C/D/I No cellulitis present Compartment soft  LABS  Recent Labs  11/26/13 0527 11/27/13 0513  HGB 10.0* 9.0*  HCT 31.0* 28.6*  WBC 14.7* 13.4*  PLT 367 365     Recent Labs  11/26/13 0527 11/27/13 0513  NA 137 138  K 4.2 4.0  BUN 8 9  CREATININE 0.81 0.74  GLUCOSE 152* 134*     Assessment/Plan: 2 Days Post-Op Procedure(s) (LRB): LEFT HIP EXCISIONAL AND NON EXCISIONAL DEBRIDEMENT ACESS LEFT HIP COMPONENT  (Left) Up with therapy Discharge home with home health Follow up in 2 weeks at [facility]. Follow up with OLIN,Staley Budzinski D in 2 weeks.  Contact information:  [facility] [address], [place] [phone]    Expected ABLA  Treated with iron and will observe  Obese (BMI 30-39.9) Estimated body mass index is 35.53 kg/(m^2) as calculated from the following:   Height as of this encounter: 5\' 6"  (1.676 m).   Weight as of this encounter: 99.791 kg (220 lb). Patient also counseled that weight may inhibit the healing process Patient counseled that losing weight will help with future health issues        [name]. [name]   PAC  11/27/2013, 8:35 AM

## 2013-11-27 NOTE — Progress Notes (Signed)
Physical Therapy Treatment Patient Details Name: [name] MRN: [id number] DOB: 1966-02-09 Today's Date: 11/27/2013    History of Present Illness Pt is a 48 year old female s/p L THA revision    PT Comments    Pt able to recall all hip precautions and PWB status.  Pt ambulated in hallway, practiced steps, and performed exercises.  Pt feels ready to d/c home.  Pt had no further concerns/questions.  Follow Up Recommendations  Home health PT     Equipment Recommendations  Rolling walker with 5" wheels    Recommendations for Other Services       Precautions / Restrictions Precautions Precautions: Posterior Hip Precaution Comments: pt able to verbalize all posterior hip precautions Restrictions Weight Bearing Restrictions: Yes LLE Weight Bearing: Partial weight bearing    Mobility  Bed Mobility Overal bed mobility: Modified Independent             General bed mobility comments: pt self assisted L LE  Transfers Overall transfer level: Needs assistance Equipment used: Rolling walker (2 wheeled) Transfers: Sit to/from Stand Sit to Stand: Modified independent (Device/Increase time)         General transfer comment: able to recall safe technique  Ambulation/Gait Ambulation/Gait assistance: Supervision Ambulation Distance (Feet): 400 Feet Assistive device: Rolling walker (2 wheeled) Gait Pattern/deviations: Step-through pattern;Antalgic         Stairs Stairs: Yes Stairs assistance: Supervision Stair Management: No rails;Step to pattern;Forwards;With walker Number of Stairs: 2 General stair comments: pt able to recall correct sequence, verbal cues for RW placement and safety  Wheelchair Mobility    Modified Rankin (Stroke Patients Only)       Balance                                    Cognition Arousal/Alertness: Awake/alert Behavior During Therapy: WFL for tasks assessed/performed Overall Cognitive Status: Within Functional  Limits for tasks assessed                      Exercises Total Joint Exercises Ankle Circles/Pumps: AROM;Both;15 reps Quad Sets: AROM;Both;15 reps Gluteal Sets: AROM;Both;15 reps Towel Squeeze: AROM;Both;15 reps Short Arc Quad: AROM;Left;15 reps Heel Slides: AROM;Left;15 reps (all exercises within precautions) Hip ABduction/ADduction: AROM;Left;15 reps Long Arc Quad: AROM;Left;15 reps    General Comments        Pertinent Vitals/Pain Premedicated, reports pain better then yesterday, activity to tolerance    Home Living                      Prior Function            PT Goals (current goals can now be found in the care plan section) Progress towards PT goals: Progressing toward goals    Frequency  7X/week    PT Plan Current plan remains appropriate    Co-evaluation             End of Session   Activity Tolerance: Patient tolerated treatment well Patient left: with call bell/phone within reach;with family/visitor present;in chair     Time: 3762-8315 PT Time Calculation (min): 25 min  Charges:  $Gait Training: 8-22 mins $Therapeutic Exercise: 8-22 mins                    G Codes:      Junius Argyle 11/27/2013, 10:29 AM Carmelia Bake, PT, DPT 11/27/2013  Pager: [phone]

## 2013-11-27 NOTE — Progress Notes (Signed)
Discharge instructions reviewed with patient and husband utilizing teach back method. No questions or concerns at this time.

## 2013-11-27 NOTE — Progress Notes (Signed)
Occupational Therapy Treatment Patient Details Name: [name] MRN: [id number] DOB: 1965-07-31 Today's Date: 11/27/2013    History of present illness Pt is a 48 year old female s/p L THA revision      Follow Up Recommendations  Home health OT    Equipment Recommendations  None recommended by OT       Precautions / Restrictions Precautions Precautions: Posterior Hip Precaution Comments: pt able to verbalize all posterior hip precautions Restrictions Weight Bearing Restrictions: Yes LLE Weight Bearing: Partial weight bearing       Mobility Bed Mobility Overal bed mobility: Modified Independent             General bed mobility comments: pt self assisted L LE  Transfers Overall transfer level: Needs assistance Equipment used: Rolling walker (2 wheeled) Transfers: Sit to/from Stand Sit to Stand: Modified independent (Device/Increase time)         General transfer comment: able to recall safe technique        ADL                       Lower Body Dressing: Minimal assistance;Sit to/from stand   Toilet Transfer: Minimal assistance;Adhering to hip precautions;Cueing for safety   Toileting- Clothing Manipulation and Hygiene: Minimal assistance;Sit to/from stand;Adhering to hip precautions         General ADL Comments: Educated on use of AE.  Husband will help as needed , but pt will obtain a reacher.                Cognition   Behavior During Therapy: WFL for tasks assessed/performed Overall Cognitive Status: Within Functional Limits for tasks assessed                         Exercises Total Joint Exercises Ankle Circles/Pumps: AROM;Both;15 reps Quad Sets: AROM;Both;15 reps Gluteal Sets: AROM;Both;15 reps Towel Squeeze: AROM;Both;15 reps Short Arc Quad: AROM;Left;15 reps Heel Slides: AROM;Left;15 reps (all exercises within precautions) Hip ABduction/ADduction: AROM;Left;15 reps Long Arc Quad: AROM;Left;15 reps         Frequency Min 2X/week     Progress Toward Goals  OT Goals(current goals can now be found in the care plan section)  Progress towards OT goals: Progressing toward goals      End of Session     Activity Tolerance Patient tolerated treatment well   Patient Left in chair;with call bell/phone within reach           Time: 1111-1120 OT Time Calculation (min): 9 min  Charges: OT General Charges $OT Visit: 1 Procedure OT Treatments $Self Care/Home Management : 8-22 mins  Betsy Pries 11/27/2013, 12:02 PM

## 2013-11-28 NOTE — Discharge Summary (Signed)
Physician Discharge Summary  Patient ID: [name] MRN: [id number] DOB/AGE: [birth year] 48 y.o.  Admit date: 11/25/2013 Discharge date: 11/27/2013   Procedures:  Procedure(s) (LRB): LEFT HIP EXCISIONAL AND NON EXCISIONAL DEBRIDEMENT ACESS LEFT HIP COMPONENT  (Left)  Attending Physician:  Dr. 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 Thyroid disease     Hypothyroid on replacement therapy  . Headache(784.0) 6 years ago  . Arthritis   . Anemia     "younger days"  . History of blood transfusion sept 2014    1 unit    HPI:     Pt is a 48 y.o. female complaining of left hip pain. She is currently only about seven months out from a revision of a left hip from ASR components to a ceramic on polyethylene insert in September 2014. She had presented recently with persistent pain laterally which she had a cortisone injection with mild relief with increased groin pain and desire to return back to use of a cane for ambulation as well as noticable limp. She had not had any limp in the postop period. Based on this a MARS MRI was ordered to evaluate for any complicating features despite being revised to ceramic on polyethylene insert. MRI findings as reviewed with her showing increasing pseudo tumor, fluid collection over the posterior lateral aspect of the greater trochanter. Some reduction along the iliotibial band though I am unable to fully explain to her the reasoning behind this. It surely can explain a source of her persistent symptoms. If in fact this is a residual metal ion debridement that is resulting in this issue I do feel that it would be important to debride this, perform synovectomy and remove this material as much as possible, wash out the hip, reexamine the hip joint itself with possible exchange of modular components.  Exam finds she walks with a slight gluteal limp favoring this left lower extremity. She has tenderness over the lateral side of her hip. Her right hip is normal as it has not been operated on at this point. Would not want to have any long term sequellae or complications to the gluteal musculature periprosthetic bone and surgery is planned. Risks, benefits and expectations were discussed with the patient. Risks including but not limited to the risk of anesthesia, blood clots, nerve damage, blood vessel damage, failure of the prosthesis, infection and up to and including death. Patient understand the risks, benefits and expectations and wishes to proceed with surgery.   PCP: Kristine Garbe, MD   Discharged Condition: good  Hospital Course:  Patient underwent the above stated procedure on 11/25/2013. Patient tolerated the procedure well and brought to the recovery room in good condition and subsequently to the floor.  POD #1 BP: 99/66 ; Pulse: 68 ; Temp: 97.7 F (36.5 C) ; Resp: 16  Patient reports pain as mild but admittedly has not done much activity by time seen this am. Neurovascular intact, dorsiflexion/plantar flexion intact, incision: dressing C/D/I, no cellulitis present and compartment soft.   LABS  Basename    HGB  10.0  HCT  31.0   POD #2  BP: 107/69 ; Pulse: 76 ; Temp: 97.7 F (36.5 C) ; Resp: 16 Patient reports pain as mild, pain controlled. No events throughout the night. Denies any CP or SOB. Ready to  be discharged home. Neurovascular intact, dorsiflexion/plantar flexion intact, incision: dressing C/D/I, no cellulitis present and compartment soft.   LABS  Basename    HGB  9.0  HCT  28.6    Discharge Exam: General appearance: alert, cooperative and no distress Extremities: Homans sign is negative, no sign of DVT, no edema, redness or tenderness in the calves or thighs and no ulcers, gangrene or trophic changes  Disposition: Home with follow up in 2 weeks   Follow-up  Information   Follow up with Mauri Pole, MD. Schedule an appointment as soon as possible for a visit in 2 weeks.   Specialty:  Orthopedic Surgery   Contact information:   [address] [place] [address] [phone]       Discharge Instructions   Call MD / Call 911    Complete by:  As directed   If you experience chest pain or shortness of breath, CALL 911 and be transported to the hospital emergency room.  If you develope a fever above 101 F, pus (white drainage) or increased drainage or redness at the wound, or calf pain, call your surgeon's office.     Change dressing    Complete by:  As directed   Maintain surgical dressing for 10-14 days, or until follow up in the clinic.     Constipation Prevention    Complete by:  As directed   Drink plenty of fluids.  Prune juice may be helpful.  You may use a stool softener, such as Colace (over the counter) 100 mg twice a day.  Use MiraLax (over the counter) for constipation as needed.     Diet - low sodium heart healthy    Complete by:  As directed      Discharge instructions    Complete by:  As directed   Maintain surgical dressing for 10-14 days, or until follow up in the clinic. Follow up in 2 weeks at [facility]. Call with any questions or concerns.     Partial weight bearing    Complete by:  As directed   % Body Weight:  50  Laterality:  left  Extremity:  Lower     TED hose    Complete by:  As directed   Use stockings (TED hose) for 2 weeks on both leg(s).  You may remove them at night for sleeping.             Medication List         aspirin 325 MG EC tablet  Take 1 tablet (325 mg total) by mouth 2 (two) times daily.     DSS 100 MG Caps  Take 100 mg by mouth 2 (two) times daily.     ferrous sulfate 325 (65 FE) MG tablet  Take 1 tablet (325 mg total) by mouth 3 (three) times daily after meals.     HYDROcodone-acetaminophen 7.5-325 MG per tablet  Commonly known as:  NORCO  Take  1-2 tablets by mouth every 4 (four) hours as needed for moderate pain (breakthrough pain).     levothyroxine 112 MCG tablet  Commonly known as:  SYNTHROID, LEVOTHROID  Take 112 mcg by mouth daily before breakfast.     methocarbamol 500 MG tablet  Commonly known as:  ROBAXIN  Take 1 tablet (500 mg total) by mouth every 6 (six) hours as needed for muscle spasms.     polyethylene glycol packet  Commonly known as:  MIRALAX / GLYCOLAX  Take 17 g by  mouth 2 (two) times daily.         Signed: West Pugh. Malai Lady   PA-C  11/28/2013, 12:11 PM

## 2014-01-07 ENCOUNTER — Encounter (HOSPITAL_COMMUNITY): Payer: Self-pay | Admitting: Orthopedic Surgery

## 2014-07-20 ENCOUNTER — Ambulatory Visit (INDEPENDENT_AMBULATORY_CARE_PROVIDER_SITE_OTHER): Payer: 59 | Admitting: Physician Assistant

## 2014-07-20 VITALS — BP 132/70 | HR 82 | Temp 97.7°F | Resp 18 | Ht 66.0 in | Wt 216.0 lb

## 2014-07-20 DIAGNOSIS — J0100 Acute maxillary sinusitis, unspecified: Secondary | ICD-10-CM

## 2014-07-20 MED ORDER — IPRATROPIUM BROMIDE 0.03 % NA SOLN: 2.0000 | Freq: Two times a day (BID) | NASAL | Status: DC

## 2014-07-20 MED ORDER — GUAIFENESIN ER 1200 MG PO TB12: 1.0000 | ORAL_TABLET | Freq: Two times a day (BID) | ORAL | Status: DC | PRN

## 2014-07-20 NOTE — Progress Notes (Signed)
Reviewed and agree with management plan

## 2014-07-20 NOTE — Patient Instructions (Signed)

## 2014-07-20 NOTE — Progress Notes (Signed)
   Subjective:    Patient ID: [name], female    DOB: Nov 16, 1965, 49 y.o.   MRN: [id number]  HPI Patient presents for 2 days of right sided sinus pressure and congestion. Pain is felt on right side of forehead, cheek, and jaw. Is accompanied by rhinorrhea. Had sore throat that improved. H/o seasonal allergies that are not currently affecting her and former smoker. No h/o asthma. Tried Claritin-D and Zyrtec-D without any relief. Took one dose of each. May sick contacts as she works for [employer]. Med allergies: Codeine and hydrocodone.    Review of Systems  Constitutional: Negative for fever, chills, activity change and appetite change.  HENT: Positive for congestion, dental problem, rhinorrhea, sinus pressure and sore throat. Negative for ear discharge, ear pain, facial swelling, postnasal drip and sneezing.   Eyes: Positive for discharge (watery). Negative for itching.  Respiratory: Negative for cough, chest tightness, shortness of breath and wheezing.   Cardiovascular: Negative for chest pain.  Gastrointestinal: Negative for nausea and vomiting.  Musculoskeletal: Negative for neck pain.  Skin: Negative for rash.  Allergic/Immunologic: Positive for environmental allergies and food allergies (shellfish).  Neurological: Negative for dizziness and headaches.  Hematological: Negative for adenopathy.       Objective:   Physical Exam  Constitutional: She is oriented to person, place, and time. She appears well-developed and well-nourished. No distress.  Blood pressure 132/70, pulse 82, temperature 97.7 F (36.5 C), resp. rate 18, height 5\' 6"  (1.676 m), weight 216 lb (97.977 kg), SpO2 96 %.  HENT:  Head: Atraumatic.  Right Ear: Tympanic membrane, external ear and ear canal normal.  Left Ear: Tympanic membrane, external ear and ear canal normal.  Nose: Mucosal edema, rhinorrhea and sinus tenderness present. No nose lacerations. Right sinus exhibits maxillary sinus tenderness  and frontal sinus tenderness. Left sinus exhibits no maxillary sinus tenderness and no frontal sinus tenderness.  Mouth/Throat: Uvula is midline, oropharynx is clear and moist and mucous membranes are normal.  Eyes: Pupils are equal, round, and reactive to light. Right eye exhibits discharge (watery). Left eye exhibits no discharge. No scleral icterus.  Neck: Normal range of motion. Neck supple. No thyromegaly present.  Cardiovascular: Normal rate, regular rhythm and normal heart sounds.  Exam reveals no gallop and no friction rub.   No murmur heard. Pulmonary/Chest: Effort normal and breath sounds normal. No respiratory distress. She has no wheezes. She has no rales. She exhibits no tenderness.  Abdominal: Soft. Bowel sounds are normal. There is no tenderness.  Lymphadenopathy:    She has no cervical adenopathy.  Neurological: She is alert and oriented to person, place, and time.  Skin: Skin is warm and dry. No rash noted. She is not diaphoretic. No erythema. No pallor.        Assessment & Plan:  1. Acute maxillary sinusitis, recurrence not specified Take Mucinex with plenty of fluid. - Guaifenesin (MUCINEX MAXIMUM STRENGTH) 1200 MG TB12; Take 1 tablet (1,200 mg total) by mouth every 12 (twelve) hours as needed.  Dispense: 14 tablet; Refill: 1 - ipratropium (ATROVENT) 0.03 % nasal spray; Place 2 sprays into both nostrils 2 (two) times daily.  Dispense: 30 mL; Refill: 0   Blessyn Sommerville PA-C  [facility] 07/20/2014 10:52 AM

## 2014-08-11 ENCOUNTER — Emergency Department (HOSPITAL_COMMUNITY): Payer: 59

## 2014-08-11 ENCOUNTER — Emergency Department (HOSPITAL_COMMUNITY)
Admission: EM | Admit: 2014-08-11 | Discharge: 2014-08-11 | Disposition: A | Discharge status: Home / Self Care | Payer: 59 | Attending: Emergency Medicine | Admitting: Emergency Medicine

## 2014-08-11 ENCOUNTER — Encounter (HOSPITAL_COMMUNITY): Payer: Self-pay

## 2014-08-11 DIAGNOSIS — Z862 Personal history of diseases of the blood and blood-forming organs and certain disorders involving the immune mechanism: Secondary | ICD-10-CM | POA: Insufficient documentation

## 2014-08-11 DIAGNOSIS — Z87891 Personal history of nicotine dependence: Secondary | ICD-10-CM | POA: Diagnosis not present

## 2014-08-11 DIAGNOSIS — Z7982 Long term (current) use of aspirin: Secondary | ICD-10-CM | POA: Insufficient documentation

## 2014-08-11 DIAGNOSIS — Z79899 Other long term (current) drug therapy: Secondary | ICD-10-CM | POA: Insufficient documentation

## 2014-08-11 DIAGNOSIS — E039 Hypothyroidism, unspecified: Secondary | ICD-10-CM | POA: Diagnosis not present

## 2014-08-11 DIAGNOSIS — R079 Chest pain, unspecified: Secondary | ICD-10-CM

## 2014-08-11 DIAGNOSIS — M1990 Unspecified osteoarthritis, unspecified site: Secondary | ICD-10-CM | POA: Diagnosis not present

## 2014-08-11 LAB — CBC
HCT: 34.9 % — ABNORMAL LOW (ref 36.0–46.0)
HEMOGLOBIN: 11.3 g/dL — AB (ref 12.0–15.0)
MCH: 27.8 pg (ref 26.0–34.0)
MCHC: 32.4 g/dL (ref 30.0–36.0)
MCV: 86 fL (ref 78.0–100.0)
Platelets: 418 10*3/uL — ABNORMAL HIGH (ref 150–400)
RBC: 4.06 MIL/uL (ref 3.87–5.11)
RDW: 13.7 % (ref 11.5–15.5)
WBC: 10 10*3/uL (ref 4.0–10.5)

## 2014-08-11 LAB — I-STAT TROPONIN, ED
Troponin i, poc: 0 ng/mL (ref 0.00–0.08)
Troponin i, poc: 0 ng/mL (ref 0.00–0.08)

## 2014-08-11 LAB — BASIC METABOLIC PANEL
Anion gap: 5 (ref 5–15)
BUN: 8 mg/dL (ref 6–23)
CALCIUM: 8.8 mg/dL (ref 8.4–10.5)
CO2: 27 mmol/L (ref 19–32)
Chloride: 105 mmol/L (ref 96–112)
Creatinine, Ser: 0.96 mg/dL (ref 0.50–1.10)
GFR calc Af Amer: 80 mL/min — ABNORMAL LOW (ref >90)
GFR, EST NON AFRICAN AMERICAN: 69 mL/min — AB (ref >90)
GLUCOSE: 96 mg/dL (ref 70–99)
Potassium: 3.8 mmol/L (ref 3.5–5.1)
Sodium: 137 mmol/L (ref 135–145)

## 2014-08-11 MED ORDER — ASPIRIN 81 MG PO CHEW
324.0000 mg | CHEWABLE_TABLET | Freq: Once | ORAL | Status: AC
  Administered 2014-08-11: 324 mg via ORAL
  Filled 2014-08-11: qty 4

## 2014-08-11 NOTE — Discharge Instructions (Signed)

## 2014-08-11 NOTE — ED Provider Notes (Signed)
CSN: [id number]     Arrival date & time 08/11/14  1938 History   First MD Initiated Contact with Patient 08/11/14 1949     Chief Complaint  Patient presents with  . Chest Pain    Patient is a 49 y.o. female presenting with chest pain. The history is provided by the patient.  Chest Pain Pain location:  L chest Pain quality: sharp   Pain radiates to:  Does not radiate Pain radiates to the back: no   Pain severity:  Moderate Onset quality:  Gradual Duration:  13 hours Timing:  Constant Progression:  Waxing and waning Chronicity:  New Relieved by:  Nothing Worsened by:  Certain positions and deep breathing Associated symptoms: no cough, no diaphoresis, no dizziness, no fever, no lower extremity edema, no nausea, no near-syncope, no shortness of breath, no syncope, not vomiting and no weakness   Risk factors: no birth control, no coronary artery disease, no diabetes mellitus, no high cholesterol, no hypertension, no prior DVT/PE and no smoking   PT reports waking up this morning with CP Constant all day and worse with movement/deep breathing She reports she had mild Left UE numbness (no weakness) early in the morning but none now and did not radiate to arm (nurse notes report it radiated from chest to arm) She has no other symptoms  No family h/o CAD per patient No personal h/o CAD/PE/DVT No trauma/falls/heavy lifting reported  Past Medical History  Diagnosis Date  . Thyroid disease     Hypothyroid on replacement therapy  . Headache(784.0) 6 years ago  . Arthritis   . Anemia     "younger days"  . History of blood transfusion sept 2014    1 unit  . Allergy    Past Surgical History  Procedure Laterality Date  . Joint replacement      Left hip replaced in 2006  . Endometrial ablation    . Cesarean section  1990, 1991  . Tubal ligation    . Total hip revision Left 03/04/2013    Procedure: LEFT TOTAL HIP REVISION ;  Surgeon: Mauri Pole, MD;  Location: [facility];  Service:  Orthopedics;  Laterality: Left;  . Total hip revision Left 11/25/2013    Procedure: LEFT HIP EXCISIONAL AND NON EXCISIONAL DEBRIDEMENT ACESS LEFT HIP COMPONENT ;  Surgeon: Mauri Pole, MD;  Location: [facility];  Service: Orthopedics;  Laterality: Left;   Family History  Problem Relation Age of Onset  . Hypertension Mother   . Diabetes Mother   . Hypertension Father   . Cancer Father    History  Substance Use Topics  . Smoking status: Former Smoker -- 2 years    Types: Cigarettes    Quit date: 06/27/2000  . Smokeless tobacco: Never Used     Comment: social smoker  . Alcohol Use: No   OB History    No data available     Review of Systems  Constitutional: Negative for fever and diaphoresis.  Respiratory: Negative for cough and shortness of breath.   Cardiovascular: Positive for chest pain. Negative for leg swelling, syncope and near-syncope.  Gastrointestinal: Negative for nausea and vomiting.  Neurological: Negative for dizziness, syncope and weakness.  All other systems reviewed and are negative.     Allergies  Codeine; Shellfish allergy; and Oxycodone  Home Medications   Prior to Admission medications   Medication Sig Start Date End Date Taking? Authorizing Provider  aspirin EC 325 MG EC tablet Take 1 tablet (  325 mg total) by mouth 2 (two) times daily. 11/27/13   Lucille Passy Babish, PA-C  Guaifenesin [facility] MAXIMUM STRENGTH) 1200 MG TB12 Take 1 tablet (1,200 mg total) by mouth every 12 (twelve) hours as needed. 07/20/14   Tishira R Brewington, PA-C  ipratropium (ATROVENT) 0.03 % nasal spray Place 2 sprays into both nostrils 2 (two) times daily. 07/20/14   Tishira R Brewington, PA-C  levothyroxine (SYNTHROID, LEVOTHROID) 112 MCG tablet Take 100 mcg by mouth daily before breakfast.     Historical Provider, MD   BP 119/83 mmHg  Pulse 74  Temp(Src) 98.2 F (36.8 C)  Resp 16  SpO2 98% Physical Exam CONSTITUTIONAL: Well developed/well nourished HEAD:  Normocephalic/atraumatic EYES: EOMI/PERRL ENMT: Mucous membranes moist NECK: supple no meningeal signs SPINE/BACK:entire spine nontender CV: S1/S2 noted, no murmurs/rubs/gallops noted Chest - mild tenderness to left chest - no bruising noted LUNGS: Lungs are clear to auscultation bilaterally, no apparent distress ABDOMEN: soft, nontender, no rebound or guarding, bowel sounds noted throughout abdomen GU:no cva tenderness NEURO: Pt is awake/alert/appropriate, moves all extremitiesx4.  No facial droop.   EXTREMITIES: pulses normal/equal, full ROM, no LE edema/tenderness SKIN: warm, color normal PSYCH: no abnormalities of mood noted, alert and oriented to situation  ED Course  Procedures  8:55 PM Pt currently PERC negative HEART score less than 3  I have low suspicion for ACS/PE/Dissection Will repeat ekg/troponin at 3 hr mark 11:14 PM Repeat EKG unchanged Repeat troponin negative She is well appearing, no acute worsening while in the ED We discussed strict return precautions She is appropriate for d/c home BP 114/76 mmHg  Pulse 70  Temp(Src) 98.2 F (36.8 C)  Resp 20  SpO2 100%  Labs Review Labs Reviewed  CBC - Abnormal; Notable for the following:    Hemoglobin 11.3 (*)    HCT 34.9 (*)    Platelets 418 (*)    All other components within normal limits  BASIC METABOLIC PANEL - Abnormal; Notable for the following:    GFR calc non Af Amer 69 (*)    GFR calc Af Amer 80 (*)    All other components within normal limits  I-STAT TROPOININ, ED  Randolm Idol, ED    Imaging Review Dg Chest 2 View  08/11/2014   CLINICAL DATA:  Left chest pain for 1 day  EXAM: CHEST  2 VIEW  COMPARISON:  May 10, 2005  FINDINGS: The heart size and mediastinal contours are within normal limits. The lung volumes are low. There is mild increased pulmonary interstitium bilaterally. There is no focal pneumonia or pleural effusion. The visualized skeletal structures are stable.  IMPRESSION: Mild  increased pulmonary interstitium bilaterally. This can be seen in bronchitis if the patient has symptoms of infection. Alternatively, mild interstitial edema can have this appearance.   Electronically Signed   By: Abelardo Diesel M.D.   On: 08/11/2014 20:15     EKG Interpretation   Date/Time:  Monday August 11 2014 19:41:23 EST Ventricular Rate:  77 PR Interval:  148 QRS Duration: 86 QT Interval:  388 QTC Calculation: 439 R Axis:   71 Text Interpretation:  Normal sinus rhythm Normal ECG No significant change  since last tracing Confirmed by [place] ([id number]) on 08/11/2014  7:50:16 PM     Medications  aspirin chewable tablet 324 mg (324 mg Oral Given 08/11/14 2044)    EKG Interpretation  Date/Time:  Monday August 11 2014 23:04:54 EST Ventricular Rate:  63 PR Interval:  165 QRS  Duration: 100 QT Interval:  442 QTC Calculation: 452 R Axis:   76 Text Interpretation:  Sinus rhythm Normal ECG No significant change since last tracing Confirmed by Christy Gentles  MD, Elenore Rota ([id number]) on 08/11/2014 11:12:21 PM        MDM   Final diagnoses:  Chest pain, unspecified chest pain type    Nursing notes including past medical history and social history reviewed and considered in documentation xrays/imaging reviewed by myself and considered during evaluation Labs/vital reviewed myself and considered during evaluation     Sharyon Cable, MD 08/11/14 2315

## 2014-08-11 NOTE — ED Notes (Signed)
Pt sts she had chest pain that awoke her at 6 am today. Sts pain has gotten worse throughout the whole day.  Denies any other s/s.  Pain radiates down left arm.  Pain 8/10.

## 2014-12-27 ENCOUNTER — Ambulatory Visit (INDEPENDENT_AMBULATORY_CARE_PROVIDER_SITE_OTHER): Payer: 59 | Admitting: Urgent Care

## 2014-12-27 VITALS — BP 110/80 | HR 84 | Temp 99.1°F | Resp 16 | Ht 66.0 in | Wt 231.0 lb

## 2014-12-27 DIAGNOSIS — J029 Acute pharyngitis, unspecified: Secondary | ICD-10-CM

## 2014-12-27 DIAGNOSIS — R059 Cough, unspecified: Secondary | ICD-10-CM

## 2014-12-27 DIAGNOSIS — R05 Cough: Secondary | ICD-10-CM

## 2014-12-27 MED ORDER — BENZONATATE 100 MG PO CAPS: 100.0000 mg | ORAL_CAPSULE | Freq: Three times a day (TID) | ORAL | Status: DC | PRN

## 2014-12-27 NOTE — Progress Notes (Signed)
    MRN: [id number] DOB: 01-21-1966  Subjective:   [name] is a 49 y.o. female presenting for chief complaint of Sore Throat and Cough  Reports 2 day history of throat pain and productive cough without hemoptysis. Has tried Zyrtec, Allegra, gargling salt water with minimal relief. Denies Sinus congestion, rhinorrhea, sinus pain, itchy watery eyes, red eyes, ear fullness, ear pain, wheezing, shortness of breath, chest tightness, chest pain and myalgia, fever, fatigue, nausea, vomiting and abdominal pain. Denies smoking or alcohol use. Denies any other aggravating or relieving factors, no other questions or concerns.  [name] has a current medication list which includes the following prescription(s): aspirin, cetirizine, and levothyroxine. She is allergic to codeine; shellfish allergy; and oxycodone.  [name]  has a past medical history of Thyroid disease; Headache(784.0) (6 years ago); Arthritis; Anemia; History of blood transfusion (sept 2014); and Allergy. Also  has past surgical history that includes Joint replacement; Endometrial ablation; Cesarean section (1990, 1991); Tubal ligation; Total hip revision (Left, 03/04/2013); and Total hip revision (Left, 11/25/2013).  ROS As in subjective.  Objective:   Vitals: BP 110/80 mmHg  Pulse 84  Temp(Src) 99.1 F (37.3 C) (Oral)  Resp 16  Ht 5\' 6"  (1.676 m)  Wt 231 lb (104.781 kg)  BMI 37.30 kg/m2  SpO2 98%  Physical Exam  Constitutional: She is oriented to person, place, and time. She appears well-developed and well-nourished.  HENT:  TM's flat bilaterally, no effusions or erythema. Nasal turbinates pink and moist. No sinus tenderness. Postnasal drip present, without oropharyngeal exudates, erythema or abscesses.  Eyes: Conjunctivae are normal. Right eye exhibits no discharge. Left eye exhibits no discharge. No scleral icterus.  Neck: Normal range of motion. Neck supple.  Cardiovascular: Normal rate, regular rhythm and intact  distal pulses.  Exam reveals no gallop and no friction rub.   No murmur heard. Pulmonary/Chest: No stridor. No respiratory distress. She has no wheezes. She has no rales.  Musculoskeletal: She exhibits no edema.  Lymphadenopathy:    She has cervical adenopathy (left, anterior).  Neurological: She is alert and oriented to person, place, and time.    Assessment and Plan :   1. Cough 2. Sore throat 3. Pharyngitis - Likely undergoing viral pharyngitis, offered supportive care, rtc in 1 week if symptoms fail to improve or worsen. Consider Pen VK course at that point.  Brittney Eagles, PA-C [facility] [phone] 12/27/2014 8:25 AM

## 2014-12-27 NOTE — Patient Instructions (Signed)

## 2014-12-30 ENCOUNTER — Telehealth: Payer: Self-pay

## 2014-12-30 MED ORDER — MAGIC MOUTHWASH W/LIDOCAINE: 10.0000 mL | ORAL | Status: DC | PRN

## 2014-12-30 NOTE — Telephone Encounter (Signed)
Sore throat not improved according to patient. No change since taking tessalon. Explained to patient that virus has to run course, but I can write rx for [facility] mouthwash or she can get cepacol from [facility]. She prefers rx. Mouthwash instructions discussed. RX ready for pick by patient.

## 2014-12-30 NOTE — Telephone Encounter (Signed)
Pt is still having a sore throat and would like to know what she should do next

## 2014-12-30 NOTE — Telephone Encounter (Signed)
[name] said for pt to RTC if not better in one week on 7/2. Pt said her throat is more irritated than before. Informed pt that she will probably have to RTC to have it re-evaluated.

## 2015-04-11 IMAGING — CR DG PORTABLE PELVIS
1 series · 1 of 1 positions shown · non-contrast
Comparison: Prior radiographs of the left hip 11/06/2012

CLINICAL DATA: Postoperative evaluation

EXAM:
PORTABLE LEFT HIP - 1 VIEW; PORTABLE PELVIS 1-2 VIEWS

[AP]
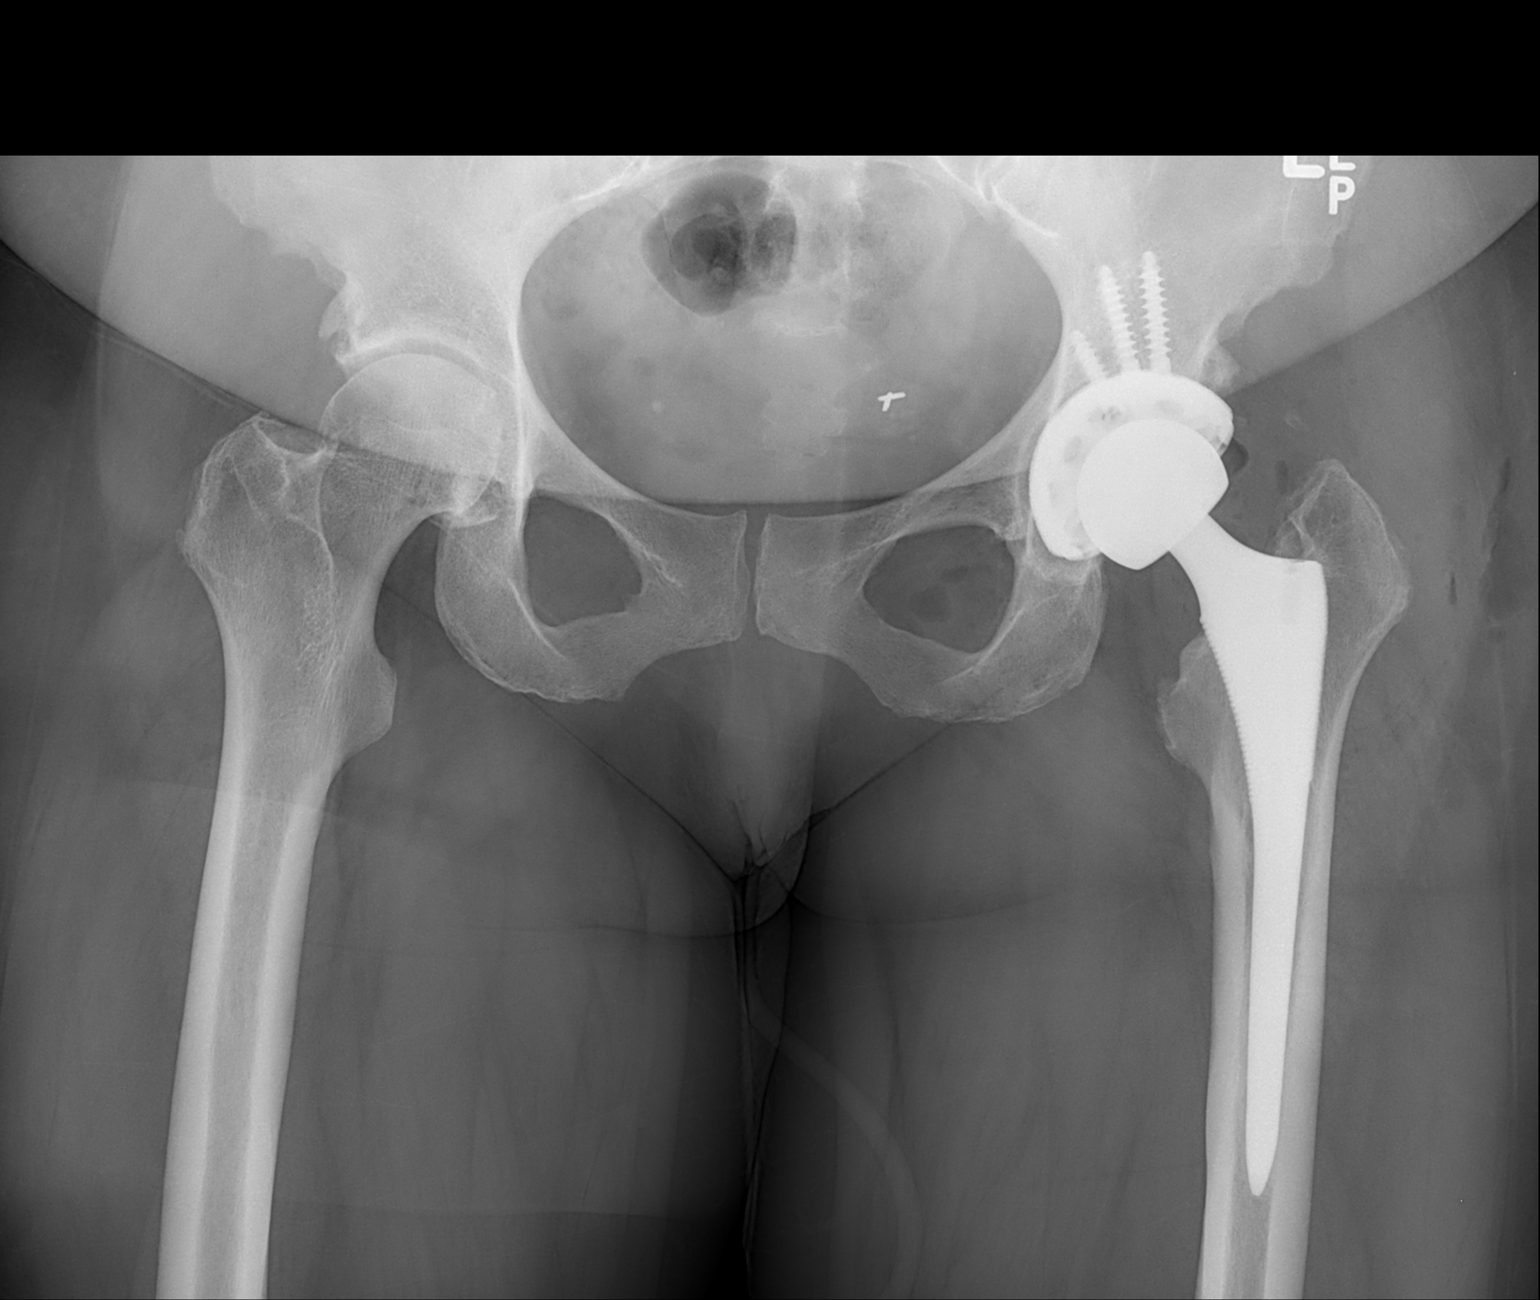

[1 of 1 positions shown; findings below may reference images not displayed]

FINDINGS: Interval surgical changes of revision of left hip arthroplasty. The
femoral component appears unchanged. However, there is a new
acetabular cup. The femoral head component is well located within
the acetabular component on the cross-table lateral view. No
evidence of hardware complication or periprosthetic fracture.
Expected postsurgical changes with subcutaneous emphysema overlying
the left greater trochanter and within the joint space. The bony
pelvis appears intact. The right hip shows mild degenerative
osteoarthritis.
IMPRESSION: Revision left total hip arthroplasty without evidence of immediate
hardware complication.

## 2015-09-07 ENCOUNTER — Ambulatory Visit (INDEPENDENT_AMBULATORY_CARE_PROVIDER_SITE_OTHER): Payer: 59 | Admitting: Physician Assistant

## 2015-09-07 VITALS — BP 118/84 | HR 87 | Temp 98.0°F | Resp 16 | Ht 67.0 in | Wt 236.0 lb

## 2015-09-07 DIAGNOSIS — J0100 Acute maxillary sinusitis, unspecified: Secondary | ICD-10-CM | POA: Diagnosis not present

## 2015-09-07 DIAGNOSIS — Z9109 Other allergy status, other than to drugs and biological substances: Secondary | ICD-10-CM

## 2015-09-07 DIAGNOSIS — Z91048 Other nonmedicinal substance allergy status: Secondary | ICD-10-CM | POA: Diagnosis not present

## 2015-09-07 MED ORDER — IPRATROPIUM BROMIDE 0.03 % NA SOLN
2.0000 | Freq: Two times a day (BID) | NASAL | Status: DC
Start: 2015-09-07 — End: 2020-09-21

## 2015-09-07 MED ORDER — AMOXICILLIN-POT CLAVULANATE 875-125 MG PO TABS: 1.0000 | ORAL_TABLET | Freq: Two times a day (BID) | ORAL | Status: AC

## 2015-09-07 NOTE — Progress Notes (Signed)
[facility] [address], [place] [address] [phone]  Date:  09/07/2015   Name:  [name]   DOB:  1965/08/19   MRN:  [id number]  PCP:  Kristine Garbe, MD    Chief Complaint: Headache; Ear Pain; and Nasal Congestion   History of Present Illness:  This is a 50 y.o. female with PMH hypothyroidism who is presenting with 1-2 days of headache, bilateral otalgia and nasal congestion. Having watery eyes. Denies cough, sob, wheezing. States she did wake with a sore throat but gone now. Denies fever, chills. She is endorsing upper teeth pain bilaterally.  Aggravating/alleviating factors: Tried mucinex but not helping. History of asthma: no History of env allergies: yes, spring time, not taking anything currently. Tobacco use: no   Review of Systems:  Review of Systems See HPI  Patient Active Problem List   Diagnosis Date Noted  . Expected blood loss anemia 03/05/2013  . Obese 03/05/2013  . S/P left TH revision 03/04/2013  . Hypothyroid 08/03/2011    Prior to Admission medications   Medication Sig Start Date End Date Taking? Authorizing Provider  aspirin EC 325 MG EC tablet Take 1 tablet (325 mg total) by mouth 2 (two) times daily. 11/27/13  Yes Danae Orleans, PA-C  levothyroxine (SYNTHROID, LEVOTHROID) 100 MCG tablet Take 100 mcg by mouth daily. 07/07/14  Yes Historical Provider, MD    Allergies  Allergen Reactions  . Codeine Shortness Of Breath, Nausea And Vomiting and Palpitations  . Shellfish Allergy Swelling  . Oxycodone Itching and Palpitations    Past Surgical History  Procedure Laterality Date  . Joint replacement      Left hip replaced in 2006  . Endometrial ablation    . Cesarean section  1990, 1991  . Tubal ligation    . Total hip revision Left 03/04/2013    Procedure: LEFT TOTAL HIP REVISION ;  Surgeon: Mauri Pole, MD;  Location: [facility];  Service: Orthopedics;  Laterality: Left;  . Total hip revision Left 11/25/2013   Procedure: LEFT HIP EXCISIONAL AND NON EXCISIONAL DEBRIDEMENT ACESS LEFT HIP COMPONENT ;  Surgeon: Mauri Pole, MD;  Location: [facility];  Service: Orthopedics;  Laterality: Left;    Social History  Substance Use Topics  . Smoking status: Former Smoker -- 2 years    Types: Cigarettes    Quit date: 06/27/2000  . Smokeless tobacco: Never Used     Comment: social smoker  . Alcohol Use: No    Family History  Problem Relation Age of Onset  . Hypertension Mother   . Diabetes Mother   . Hypertension Father   . Cancer Father     Medication list has been reviewed and updated.  Physical Examination:  Physical Exam  Constitutional: She is oriented to person, place, and time. She appears well-developed and well-nourished. No distress.  HENT:  Head: Normocephalic and atraumatic.  Right Ear: Hearing, external ear and ear canal normal. Tympanic membrane is retracted.  Left Ear: Hearing, external ear and ear canal normal. Tympanic membrane is retracted.  Nose: Mucosal edema present. Right sinus exhibits maxillary sinus tenderness (exquisite) and frontal sinus tenderness (mild). Left sinus exhibits maxillary sinus tenderness (exquisite) and frontal sinus tenderness (mild).  Mouth/Throat: Uvula is midline, oropharynx is clear and moist and mucous membranes are normal.  Eyes: Conjunctivae and lids are normal. Right eye exhibits no discharge. Left eye exhibits no discharge. No scleral icterus.  Cardiovascular: Normal rate, regular rhythm, normal heart  sounds and normal pulses.   No murmur heard. Pulmonary/Chest: Effort normal and breath sounds normal. No respiratory distress. She has no wheezes. She has no rhonchi. She has no rales.  Musculoskeletal: Normal range of motion.  Lymphadenopathy:       Head (right side): No submental, no submandibular and no tonsillar adenopathy present.       Head (left side): No submental, no submandibular and no tonsillar adenopathy present.    She has no cervical  adenopathy.  Neurological: She is alert and oriented to person, place, and time.  Skin: Skin is warm, dry and intact. No lesion and no rash noted.  Psychiatric: She has a normal mood and affect. Her speech is normal and behavior is normal. Thought content normal.   BP 118/84 mmHg  Pulse 87  Temp(Src) 98 F (36.7 C)  Resp 16  Ht 5\' 7"  (1.702 m)  Wt 236 lb (107.049 kg)  BMI 36.95 kg/m2  SpO2 98%  Assessment and Plan:  1. Acute maxillary sinusitis, recurrence not specified 2. Env allergies Symptoms only present x 2 days however pt has exquisite maxillary sinus tenderness and bilateral upper teeth pain - suspect acute sinusitis and will treat with augmentin. Watering eye indicative of allergic involvement. Treat with zyrtec prn. Nasal spray twice a day. Return in 1 week if symptoms do not improve or at any time if symptoms worsen.  - amoxicillin-clavulanate (AUGMENTIN) 875-125 MG tablet; Take 1 tablet by mouth 2 (two) times daily.  Dispense: 20 tablet; Refill: 0 - ipratropium (ATROVENT) 0.03 % nasal spray; Place 2 sprays into both nostrils 2 (two) times daily.  Dispense: 30 mL; Refill: 0   Deshay Blumenfeld V. Drenda Freeze, MHS [facility]  09/07/2015

## 2015-09-07 NOTE — Patient Instructions (Signed)
augmentin antibiotic twice a day for 10 days. You may continue home mucinex  If prescribed nasal spray, use twice a day. Start allergy medicine Hot showers, breathing in steam from shower or wafting steam from pot of boiling water, eating spicy food and neti pot with sterile water can all help your sinuses drain. If you are not getting better in 7-10 days, return to clinic

## 2015-09-16 ENCOUNTER — Other Ambulatory Visit: Payer: Self-pay

## 2015-09-16 DIAGNOSIS — Z1231 Encounter for screening mammogram for malignant neoplasm of breast: Secondary | ICD-10-CM

## 2015-10-07 ENCOUNTER — Ambulatory Visit
Admission: RE | Admit: 2015-10-07 | Discharge: 2015-10-07 | Disposition: A | Discharge status: Home / Self Care | Payer: 59 | Source: Ambulatory Visit

## 2015-10-07 DIAGNOSIS — Z1231 Encounter for screening mammogram for malignant neoplasm of breast: Secondary | ICD-10-CM

## 2016-09-09 ENCOUNTER — Encounter (HOSPITAL_BASED_OUTPATIENT_CLINIC_OR_DEPARTMENT_OTHER): Payer: Self-pay | Admitting: Emergency Medicine

## 2016-09-09 ENCOUNTER — Emergency Department (HOSPITAL_BASED_OUTPATIENT_CLINIC_OR_DEPARTMENT_OTHER)
Admission: EM | Admit: 2016-09-09 | Discharge: 2016-09-09 | Disposition: A | Discharge status: Home / Self Care | Payer: 59 | Attending: Emergency Medicine | Admitting: Emergency Medicine

## 2016-09-09 ENCOUNTER — Emergency Department (HOSPITAL_BASED_OUTPATIENT_CLINIC_OR_DEPARTMENT_OTHER): Payer: 59

## 2016-09-09 DIAGNOSIS — J029 Acute pharyngitis, unspecified: Secondary | ICD-10-CM | POA: Diagnosis not present

## 2016-09-09 DIAGNOSIS — H9203 Otalgia, bilateral: Secondary | ICD-10-CM | POA: Diagnosis not present

## 2016-09-09 DIAGNOSIS — R509 Fever, unspecified: Secondary | ICD-10-CM | POA: Diagnosis not present

## 2016-09-09 DIAGNOSIS — Z87891 Personal history of nicotine dependence: Secondary | ICD-10-CM | POA: Diagnosis not present

## 2016-09-09 DIAGNOSIS — Z7982 Long term (current) use of aspirin: Secondary | ICD-10-CM | POA: Diagnosis not present

## 2016-09-09 DIAGNOSIS — R05 Cough: Secondary | ICD-10-CM | POA: Insufficient documentation

## 2016-09-09 DIAGNOSIS — E039 Hypothyroidism, unspecified: Secondary | ICD-10-CM | POA: Insufficient documentation

## 2016-09-09 DIAGNOSIS — J111 Influenza due to unidentified influenza virus with other respiratory manifestations: Secondary | ICD-10-CM

## 2016-09-09 DIAGNOSIS — R69 Illness, unspecified: Secondary | ICD-10-CM

## 2016-09-09 LAB — RAPID STREP SCREEN (MED CTR MEBANE ONLY): STREPTOCOCCUS, GROUP A SCREEN (DIRECT): NEGATIVE

## 2016-09-09 MED ORDER — ONDANSETRON 4 MG PO TBDP: 4.0000 mg | ORAL_TABLET | Freq: Three times a day (TID) | ORAL | 0 refills | Status: DC | PRN

## 2016-09-09 MED ORDER — ACETAMINOPHEN 325 MG PO TABS
650.0000 mg | ORAL_TABLET | Freq: Once | ORAL | Status: AC | PRN
  Administered 2016-09-09: 650 mg via ORAL
  Filled 2016-09-09: qty 2

## 2016-09-09 MED ORDER — IBUPROFEN 800 MG PO TABS
800.0000 mg | ORAL_TABLET | Freq: Once | ORAL | Status: AC
  Administered 2016-09-09: 800 mg via ORAL
  Filled 2016-09-09: qty 1

## 2016-09-09 MED ORDER — IBUPROFEN 800 MG PO TABS: 800.0000 mg | ORAL_TABLET | Freq: Three times a day (TID) | ORAL | 0 refills | Status: DC

## 2016-09-09 MED ORDER — BENZONATATE 100 MG PO CAPS: 100.0000 mg | ORAL_CAPSULE | Freq: Three times a day (TID) | ORAL | 0 refills | Status: DC

## 2016-09-09 NOTE — ED Provider Notes (Signed)
[facility] DE[facility] Provider Note   CSN: [id number] Arrival date & time: 09/09/16  1922  By signing my name below, I, Jeanell Sparrow, attest that this documentation has been prepared under the direction and in the presence of non-physician practitioner, Arlean Hopping, PA-C. Electronically Signed: Jeanell Sparrow, Scribe. 09/09/2016. 9:01 PM.  History   Chief Complaint Chief Complaint  Patient presents with  . Fever   The history is provided by the patient. No language interpreter was used.   HPI Comments: [name] is a 51 y.o. female who presents to the Emergency Department complaining of subjective fever, sinus pressure, bilateral ear pain, and nonproductive cough for the past four days. She previously had a sore throat and states she was seen by her PCP 2 days ago and given Cefdinir. She states that her sore throat has resolved, however, her other symptoms have not. She also took Allegra D, Benadryl, and Advil Sinus without complete relief. She endorses a poor fluid intake. She denies any ear drainage, difficulty breathing, chest pain, N/V/D, abdominal pain, or any other complaints.    PCP: Kristine Garbe, MD  Past Medical History:  Diagnosis Date  . Allergy   . Anemia    "younger days"  . Arthritis   . Headache(784.0) 6 years ago  . History of blood transfusion sept 2014   1 unit  . Thyroid disease    Hypothyroid on replacement therapy    Patient Active Problem List   Diagnosis Date Noted  . Expected blood loss anemia 03/05/2013  . Obese 03/05/2013  . S/P left TH revision 03/04/2013  . Hypothyroid 08/03/2011    Past Surgical History:  Procedure Laterality Date  . [place]  . ENDOMETRIAL ABLATION    . JOINT REPLACEMENT     Left hip replaced in 2006  . TOTAL HIP REVISION Left 03/04/2013   Procedure: LEFT TOTAL HIP REVISION ;  Surgeon: Mauri Pole, MD;  Location: [facility];  Service: Orthopedics;  Laterality: Left;  . TOTAL HIP REVISION Left  11/25/2013   Procedure: LEFT HIP EXCISIONAL AND NON EXCISIONAL DEBRIDEMENT ACESS LEFT HIP COMPONENT ;  Surgeon: Mauri Pole, MD;  Location: [facility];  Service: Orthopedics;  Laterality: Left;  . TUBAL LIGATION      OB History    No data available       Home Medications    Prior to Admission medications   Medication Sig Start Date End Date Taking? Authorizing Provider  aspirin EC 325 MG EC tablet Take 1 tablet (325 mg total) by mouth 2 (two) times daily. 11/27/13   Danae Orleans, PA-C  benzonatate (TESSALON) 100 MG capsule Take 1 capsule (100 mg total) by mouth every 8 (eight) hours. 09/09/16   Shawn C Joy, PA-C  ibuprofen (ADVIL,MOTRIN) 800 MG tablet Take 1 tablet (800 mg total) by mouth 3 (three) times daily. 09/09/16   Shawn C Joy, PA-C  ipratropium (ATROVENT) 0.03 % nasal spray Place 2 sprays into both nostrils 2 (two) times daily. 09/07/15   Ezekiel Slocumb, PA-C  levothyroxine (SYNTHROID, LEVOTHROID) 100 MCG tablet Take 100 mcg by mouth daily. 07/07/14   Historical Provider, MD  ondansetron (ZOFRAN ODT) 4 MG disintegrating tablet Take 1 tablet (4 mg total) by mouth every 8 (eight) hours as needed for nausea or vomiting. 09/09/16   Lorayne Bender, PA-C    Family History Family History  Problem Relation Age of Onset  . Hypertension Mother   . Diabetes Mother   .  Hypertension Father   . Cancer Father     Social History Social History  Substance Use Topics  . Smoking status: Former Smoker    Years: 2.00    Types: Cigarettes    Quit date: 06/27/2000  . Smokeless tobacco: Never Used     Comment: social smoker  . Alcohol use No     Allergies   Codeine; Shellfish allergy; and Oxycodone   Review of Systems Review of Systems  Constitutional: Positive for fever (Subjective).  HENT: Positive for ear pain and sinus pressure. Negative for ear discharge and trouble swallowing.   Respiratory: Positive for cough. Negative for shortness of breath.   Cardiovascular: Negative for chest pain.   Gastrointestinal: Negative for abdominal pain, diarrhea, nausea and vomiting.  Neurological: Negative for dizziness, light-headedness and headaches.  All other systems reviewed and are negative.    Physical Exam Updated Vital Signs BP (!) 151/107 (BP Location: Right Arm)   Pulse (!) 122   Temp 100.2 F (37.9 C) (Oral)   Resp 18   Ht 5\' 6"  (1.676 m)   Wt 240 lb (108.9 kg)   SpO2 100%   BMI 38.74 kg/m   Physical Exam  Constitutional: She appears well-developed and well-nourished. No distress.  HENT:  Head: Normocephalic and atraumatic.  Right Ear: Tympanic membrane, external ear and ear canal normal.  Left Ear: Tympanic membrane, external ear and ear canal normal.  Mouth/Throat: Uvula is midline, oropharynx is clear and moist and mucous membranes are normal.  Eyes: Conjunctivae are normal. Pupils are equal, round, and reactive to light.  Neck: Normal range of motion. Neck supple.  Cardiovascular: Normal rate, regular rhythm, normal heart sounds and intact distal pulses.   Pulmonary/Chest: Effort normal and breath sounds normal. No respiratory distress.  Abdominal: Soft. There is no tenderness. There is no guarding.  Musculoskeletal: She exhibits no edema.  Lymphadenopathy:    She has no cervical adenopathy.  Neurological: She is alert.  Skin: Skin is warm and dry. She is not diaphoretic.  Psychiatric: She has a normal mood and affect. Her behavior is normal.  Nursing note and vitals reviewed.    ED Treatments / Results  DIAGNOSTIC STUDIES: Oxygen Saturation is 100% on RA, normal by my interpretation.    COORDINATION OF CARE: 9:05 PM- Pt advised of plan for treatment and pt agrees.  Labs (all labs ordered are listed, but only abnormal results are displayed) Labs Reviewed  RAPID STREP SCREEN (NOT AT [facility])  CULTURE, GROUP A STREP [facility])    EKG  EKG Interpretation None       Radiology No results found.  Procedures Procedures (including critical care  time)  Medications Ordered in ED Medications  acetaminophen (TYLENOL) tablet 650 mg (650 mg Oral Given 09/09/16 1946)  ibuprofen (ADVIL,MOTRIN) tablet 800 mg (800 mg Oral Given 09/09/16 2147)     Initial Impression / Assessment and Plan / ED Course  I have reviewed the triage vital signs and the nursing notes.  Pertinent labs & imaging results that were available during my care of the patient were reviewed by me and considered in my medical decision making (see chart for details).     Patient presents with symptoms consistent with URI versus influenza-like illness. She is nontoxic appearing and I have a low suspicion for sepsis. Vital signs improved with oral medications and fluids. PCP follow-up for continued management. The patient was given instructions for home care as well as return precautions. Patient voices understanding of these instructions,  accepts the plan, and is comfortable with discharge.    Vitals:   09/09/16 1932 09/09/16 1933 09/09/16 1940 09/09/16 2211  BP: (!) 151/107   (!) 128/97  Pulse: (!) 118  (!) 122 88  Resp: 16  18 18   Temp: 98.2 F (36.8 C)  100.2 F (37.9 C) 98.5 F (36.9 C)  TempSrc: Oral  Oral Oral  SpO2: 99%  100% 100%  Weight:  108.9 kg    Height:  5\' 6"  (1.676 m)       Final Clinical Impressions(s) / ED Diagnoses   Final diagnoses:  Influenza-like illness    New Prescriptions Discharge Medication List as of 09/09/2016 10:32 PM    START taking these medications   Details  benzonatate (TESSALON) 100 MG capsule Take 1 capsule (100 mg total) by mouth every 8 (eight) hours., Starting Fri 09/09/2016, Print    ibuprofen (ADVIL,MOTRIN) 800 MG tablet Take 1 tablet (800 mg total) by mouth 3 (three) times daily., Starting Fri 09/09/2016, Print    ondansetron (ZOFRAN ODT) 4 MG disintegrating tablet Take 1 tablet (4 mg total) by mouth every 8 (eight) hours as needed for nausea or vomiting., Starting Fri 09/09/2016, Print       I personally  performed the services described in this documentation, which was scribed in my presence. The recorded information has been reviewed and is accurate.    Lorayne Bender, PA-C 09/12/16 1994    Leo Grosser, MD 09/12/16 [phone]

## 2016-09-09 NOTE — ED Notes (Signed)
PT reports fever, Cough, chills, body aches, and nasal congestion since Tuesday. Pt states she went to her PCP and started antibiotics and her throat stopped hurting but none of her other symptoms went away.

## 2016-09-09 NOTE — Discharge Instructions (Signed)
Your symptoms are consistent with a viral illness. Viruses do not require antibiotics. Treatment is symptomatic care and it is important to note that these symptoms may last for 7-14 days.   Hand washing: Wash your hands throughout the day, but especially before and after touching the face, using the restroom, sneezing, coughing, or touching surfaces that have been coughed or sneezed upon. Hydration: Symptoms will be intensified and complicated by dehydration. Dehydration can also extend the duration of symptoms. Drink plenty of fluids and get plenty of rest. You should be drinking at least half a liter of water an hour to stay hydrated. Electrolyte drinks are also encouraged. You should be drinking enough fluids to make your urine light yellow, almost clear. If this is not the case, you are not drinking enough water. Please note that some of the treatments indicated below will not be effective if you are not adequately hydrated. Pain or fever: Ibuprofen, Naproxen, or Tylenol for pain or fever.  Nausea/vomiting: Use the Zofran for nausea or vomiting.  Cough: Use the Tessalon for cough.  Congestion: Plain Mucinex may help relieve congestion. Saline sinus rinses and saline nasal sprays may also help relieve congestion.  Sore throat: Warm liquids or Chloraseptic spray may help soothe a sore throat. Gargle twice a day with a salt water solution made from a half teaspoon of salt in a cup of warm water.  Follow up: Follow up with a primary care provider, as needed, for any future management of this issue.

## 2016-09-09 NOTE — ED Notes (Signed)
ED Provider at bedside. 

## 2016-09-12 LAB — CULTURE, GROUP A STREP (THRC)

## 2017-07-26 ENCOUNTER — Other Ambulatory Visit: Payer: Self-pay | Admitting: Family Medicine

## 2017-07-26 DIAGNOSIS — Z1231 Encounter for screening mammogram for malignant neoplasm of breast: Secondary | ICD-10-CM

## 2017-08-09 ENCOUNTER — Ambulatory Visit
Admission: RE | Admit: 2017-08-09 | Discharge: 2017-08-09 | Disposition: A | Discharge status: Home / Self Care | Payer: 59 | Source: Ambulatory Visit | Attending: Family Medicine | Admitting: Family Medicine

## 2017-08-09 DIAGNOSIS — Z1231 Encounter for screening mammogram for malignant neoplasm of breast: Secondary | ICD-10-CM

## 2017-11-13 ENCOUNTER — Ambulatory Visit: Payer: 59 | Admitting: Neurology

## 2017-11-13 ENCOUNTER — Encounter: Payer: Self-pay | Admitting: Neurology

## 2017-11-13 VITALS — BP 115/82 | HR 89 | Ht 66.0 in | Wt 231.0 lb

## 2017-11-13 DIAGNOSIS — M792 Neuralgia and neuritis, unspecified: Secondary | ICD-10-CM

## 2017-11-13 MED ORDER — GABAPENTIN 100 MG PO CAPS: 100.0000 mg | ORAL_CAPSULE | Freq: Three times a day (TID) | ORAL | 6 refills | Status: DC

## 2017-11-13 NOTE — Progress Notes (Signed)
PATIENT: [name] DOB: [birth year]  Chief Complaint  Patient presents with  . Headache    She is having intermittent, shooting pains from the top of her head, radiating to her neck and left shoulder.  She will also, occasionally, have left ear pain.  Prednisone was unhelpful.   Marland Kitchen PCP    Lin Landsman, MD     HISTORICAL  [name] is a 52 years old female, seen in refer by her primary care doctor Lin Landsman, for evaluation of headaches, initial evaluation was on Nov 13, 2017.  I have reviewed and summarized the referring note, she has history of left hip replacement, chronic migraine in the past,  Her typical migraines are bilateral frontal severe pounding headache with associated light noise sensitivity nauseous, lasting for a few hours.  Now she has different kind of headaches since April 2019, was no clear triggers, she describes radiating pain from left occipital region forward to vertex, sometimes radiating discomfort to left shoulder, transient, sharp, lasting 5 to 10 minutes, it can happen multiple times the severe pain, the day, she denies arm paresthesia or weakness, denies gait abnormality, denies visual loss,  Dhungel sharp radiating pain, she has difficulty moving her neck, I was able to reduce the pain by having deep palpitation at the left  nuchal line,  REVIEW OF SYSTEMS: Full 14 system review of systems performed and notable only for headache, dizziness, allergies, runny nose  ALLERGIES: Allergies  Allergen Reactions  . Codeine Shortness Of Breath, Nausea And Vomiting and Palpitations  . Shellfish Allergy Swelling  . Oxycodone Itching and Palpitations    HOME MEDICATIONS: Current Outpatient Medications  Medication Sig Dispense Refill  . aspirin EC 325 MG EC tablet Take 1 tablet (325 mg total) by mouth 2 (two) times daily. 60 tablet 0  . benzonatate (TESSALON) 100 MG capsule Take 1 capsule (100 mg total) by mouth every 8 (eight) hours. (Patient  taking differently: Take 100 mg by mouth as needed. ) 21 capsule 0  . ibuprofen (ADVIL,MOTRIN) 800 MG tablet Take 1 tablet (800 mg total) by mouth 3 (three) times daily. (Patient taking differently: Take 800 mg by mouth every 8 (eight) hours as needed. ) 21 tablet 0  . ipratropium (ATROVENT) 0.03 % nasal spray Place 2 sprays into both nostrils 2 (two) times daily. (Patient taking differently: Place 2 sprays into both nostrils as needed. ) 30 mL 0  . levothyroxine (SYNTHROID, LEVOTHROID) 100 MCG tablet Take 100 mcg by mouth daily.  4  . ondansetron (ZOFRAN ODT) 4 MG disintegrating tablet Take 1 tablet (4 mg total) by mouth every 8 (eight) hours as needed for nausea or vomiting. 20 tablet 0   No current facility-administered medications for this visit.     PAST MEDICAL HISTORY: Past Medical History:  Diagnosis Date  . Allergy   . Anemia    "younger days"  . Arthritis   . Headache(784.0) 6 years ago  . History of blood transfusion sept 2014   1 unit  . Thyroid disease    Hypothyroid on replacement therapy    PAST SURGICAL HISTORY: Past Surgical History:  Procedure Laterality Date  . [place]  . ENDOMETRIAL ABLATION    . JOINT REPLACEMENT     Left hip replaced in 2006  . TOTAL HIP REVISION Left 03/04/2013   Procedure: LEFT TOTAL HIP REVISION ;  Surgeon: Mauri Pole, MD;  Location: [facility];  Service: Orthopedics;  Laterality:  Left;  . TOTAL HIP REVISION Left 11/25/2013   Procedure: LEFT HIP EXCISIONAL AND NON EXCISIONAL DEBRIDEMENT ACESS LEFT HIP COMPONENT ;  Surgeon: Mauri Pole, MD;  Location: [facility];  Service: Orthopedics;  Laterality: Left;  . TUBAL LIGATION      FAMILY HISTORY: Family History  Problem Relation Age of Onset  . Hypertension Mother   . Diabetes Mother   . Hypertension Father   . Cancer Father     SOCIAL HISTORY:  Social History   Socioeconomic History  . Marital status: Married    Spouse name: Not on file  . Number of children: 2   . Years of education: some college  . Highest education level: Not on file  Occupational History  . Not on file  Social Needs  . Financial resource strain: Not on file  . Food insecurity:    Worry: Not on file    Inability: Not on file  . Transportation needs:    Medical: Not on file    Non-medical: Not on file  Tobacco Use  . Smoking status: Former Smoker    Years: 2.00    Types: Cigarettes    Last attempt to quit: 06/27/2000    Years since quitting: 17.3  . Smokeless tobacco: Never Used  . Tobacco comment: social smoker  Substance and Sexual Activity  . Alcohol use: No  . Drug use: No  . Sexual activity: Not on file  Lifestyle  . Physical activity:    Days per week: Not on file    Minutes per session: Not on file  . Stress: Not on file  Relationships  . Social connections:    Talks on phone: Not on file    Gets together: Not on file    Attends religious service: Not on file    Active member of club or organization: Not on file    Attends meetings of clubs or organizations: Not on file    Relationship status: Not on file  . Intimate partner violence:    Fear of current or ex partner: Not on file    Emotionally abused: Not on file    Physically abused: Not on file    Forced sexual activity: Not on file  Other Topics Concern  . Not on file  Social History Narrative   Lives at home with husband.   Right-handed.   No caffeine use.     PHYSICAL EXAM   Vitals:   11/13/17 0842  BP: 115/82  Pulse: 89  Weight: 231 lb (104.8 kg)  Height: 5\' 6"  (1.676 m)    Not recorded      Body mass index is 37.28 kg/m.  PHYSICAL EXAMNIATION:  Gen: NAD, conversant, well nourised, obese, well groomed                     Cardiovascular: Regular rate rhythm, no peripheral edema, warm, nontender. Eyes: Conjunctivae clear without exudates or hemorrhage Neck: Supple, no carotid bruits.  Sharp radiating pain from left localized to left occipital parietal region, similar to the  pain that she described above Pulmonary: Clear to auscultation bilaterally   NEUROLOGICAL EXAM:  MENTAL STATUS: Speech:    Speech is normal; fluent and spontaneous with normal comprehension.  Cognition:     Orientation to time, place and person     Normal recent and remote memory     Normal Attention span and concentration     Normal Language, naming, repeating,spontaneous speech  Fund of knowledge   CRANIAL NERVES: CN II: Visual fields are full to confrontation. Fundoscopic exam is normal with sharp discs and no vascular changes. Pupils are round equal and briskly reactive to light. CN III, IV, VI: extraocular movement are normal. No ptosis. CN V: Facial sensation is intact to pinprick in all 3 divisions bilaterally. Corneal responses are intact.  CN VII: Face is symmetric with normal eye closure and smile. CN VIII: Hearing is normal to rubbing fingers CN IX, X: Palate elevates symmetrically. Phonation is normal. CN XI: Head turning and shoulder shrug are intact CN XII: Tongue is midline with normal movements and no atrophy.  MOTOR: There is no pronator drift of out-stretched arms. Muscle bulk and tone are normal. Muscle strength is normal.  REFLEXES: Reflexes are 2+ and symmetric at the biceps, triceps, knees, and ankles. Plantar responses are flexor.  SENSORY: Intact to light touch, pinprick, positional sensation and vibratory sensation are intact in fingers and toes.  COORDINATION: Rapid alternating movements and fine finger movements are intact. There is no dysmetria on finger-to-nose and heel-knee-shin.    GAIT/STANCE: Posture is normal. Gait is steady with normal steps, base, arm swing, and turning. Heel and toe walking are normal. Tandem gait is normal.  Romberg is absent.   DIAGNOSTIC DATA (LABS, IMAGING, TESTING) - I reviewed patient records, labs, notes, testing and imaging myself where available.   ASSESSMENT AND PLAN  [name] is a 52 y.o.  female    Left occipital neuralgia  Have suggested nerve block, patient does not want to have it  Heating pad, as needed NSAIDs, neck stretching exercise  Gabapentin 100 mg 3 times a day   [name], M.D. Ph.D.  [facility] [address], [place], [place] [address] Ph: [phone] Fax: [phone]  CC: Lin Landsman, MD

## 2017-11-13 NOTE — Patient Instructions (Signed)
You may apply heat to the left occipital region, Neck stretching exercise  Gabapentin 100 mg up to 3 times a day,  Aleve 1 to 2 tablets as needed but not daily

## 2018-06-12 DIAGNOSIS — J00 Acute nasopharyngitis [common cold]: Secondary | ICD-10-CM | POA: Diagnosis not present

## 2018-06-12 DIAGNOSIS — E663 Overweight: Secondary | ICD-10-CM | POA: Diagnosis not present

## 2018-06-12 DIAGNOSIS — E039 Hypothyroidism, unspecified: Secondary | ICD-10-CM | POA: Diagnosis not present

## 2018-07-09 DIAGNOSIS — J0100 Acute maxillary sinusitis, unspecified: Secondary | ICD-10-CM | POA: Diagnosis not present

## 2018-07-09 DIAGNOSIS — E039 Hypothyroidism, unspecified: Secondary | ICD-10-CM | POA: Diagnosis not present

## 2018-07-27 DIAGNOSIS — M79671 Pain in right foot: Secondary | ICD-10-CM | POA: Diagnosis not present

## 2018-09-09 ENCOUNTER — Other Ambulatory Visit: Payer: Self-pay

## 2018-09-09 ENCOUNTER — Encounter (HOSPITAL_BASED_OUTPATIENT_CLINIC_OR_DEPARTMENT_OTHER): Payer: Self-pay | Admitting: Emergency Medicine

## 2018-09-09 ENCOUNTER — Emergency Department (HOSPITAL_BASED_OUTPATIENT_CLINIC_OR_DEPARTMENT_OTHER)
Admission: EM | Admit: 2018-09-09 | Discharge: 2018-09-09 | Disposition: A | Discharge status: Home / Self Care | Payer: 59 | Attending: Emergency Medicine | Admitting: Emergency Medicine

## 2018-09-09 DIAGNOSIS — Z87891 Personal history of nicotine dependence: Secondary | ICD-10-CM | POA: Diagnosis not present

## 2018-09-09 DIAGNOSIS — Z7982 Long term (current) use of aspirin: Secondary | ICD-10-CM | POA: Diagnosis not present

## 2018-09-09 DIAGNOSIS — E039 Hypothyroidism, unspecified: Secondary | ICD-10-CM | POA: Diagnosis not present

## 2018-09-09 DIAGNOSIS — J0100 Acute maxillary sinusitis, unspecified: Secondary | ICD-10-CM | POA: Diagnosis not present

## 2018-09-09 DIAGNOSIS — Z79899 Other long term (current) drug therapy: Secondary | ICD-10-CM | POA: Insufficient documentation

## 2018-09-09 DIAGNOSIS — G501 Atypical facial pain: Secondary | ICD-10-CM | POA: Diagnosis present

## 2018-09-09 MED ORDER — AMOXICILLIN 500 MG PO CAPS
500.0000 mg | ORAL_CAPSULE | Freq: Three times a day (TID) | ORAL | 0 refills | Status: DC
Start: 2018-09-09 — End: 2020-09-21

## 2018-09-09 NOTE — ED Triage Notes (Signed)
Pt reports R side facial pain x 2 days. She believes it is sinus pain. Denies cough.

## 2018-09-09 NOTE — ED Provider Notes (Signed)
[facility] EMERGENCY DEPARTMENT Provider Note   CSN: [id number] Arrival date & time: 09/09/18  0900    History   Chief Complaint Chief Complaint  Patient presents with  . Facial Pain    HPI [name] is a 53 y.o. female.     Patient c/o 'sinus pressure' for past couple days. Hx sinusitis. Notes sinus congestion and pressure/pain, especially right maxillary/frontal area. Indicates tried otc meds. Denies sore throat or cough. No headache. No ear pain or decreased hearing. No fever/chills. No nv. Symptoms gradual onset, moderate, constant, slowly worse.   The history is provided by the patient.    Past Medical History:  Diagnosis Date  . Allergy   . Anemia    "younger days"  . Arthritis   . Headache(784.0) 6 years ago  . History of blood transfusion sept 2014   1 unit  . Thyroid disease    Hypothyroid on replacement therapy    Patient Active Problem List   Diagnosis Date Noted  . Neuralgia involving scalp 11/13/2017  . Expected blood loss anemia 03/05/2013  . Obese 03/05/2013  . S/P left TH revision 03/04/2013  . Hypothyroid 08/03/2011    Past Surgical History:  Procedure Laterality Date  . [place]  . ENDOMETRIAL ABLATION    . JOINT REPLACEMENT     Left hip replaced in 2006  . TOTAL HIP REVISION Left 03/04/2013   Procedure: LEFT TOTAL HIP REVISION ;  Surgeon: Mauri Pole, MD;  Location: [facility];  Service: Orthopedics;  Laterality: Left;  . TOTAL HIP REVISION Left 11/25/2013   Procedure: LEFT HIP EXCISIONAL AND NON EXCISIONAL DEBRIDEMENT ACESS LEFT HIP COMPONENT ;  Surgeon: Mauri Pole, MD;  Location: [facility];  Service: Orthopedics;  Laterality: Left;  . TUBAL LIGATION       OB History   No obstetric history on file.      Home Medications    Prior to Admission medications   Medication Sig Start Date End Date Taking? Authorizing Provider  aspirin EC 325 MG EC tablet Take 1 tablet (325 mg total) by mouth 2 (two)  times daily. 11/27/13  Yes Babish, Rodman Key, PA-C  levothyroxine (SYNTHROID, LEVOTHROID) 100 MCG tablet Take 100 mcg by mouth daily. 07/07/14  Yes [provider]  benzonatate (TESSALON) 100 MG capsule Take 1 capsule (100 mg total) by mouth every 8 (eight) hours. Patient taking differently: Take 100 mg by mouth as needed.  09/09/16   Joy, Shawn C, PA-C  gabapentin (NEURONTIN) 100 MG capsule Take 1 capsule (100 mg total) by mouth 3 (three) times daily. 11/13/17   Marcial Pacas, MD  ibuprofen (ADVIL,MOTRIN) 800 MG tablet Take 1 tablet (800 mg total) by mouth 3 (three) times daily. Patient taking differently: Take 800 mg by mouth every 8 (eight) hours as needed.  09/09/16   Joy, Shawn C, PA-C  ipratropium (ATROVENT) 0.03 % nasal spray Place 2 sprays into both nostrils 2 (two) times daily. Patient taking differently: Place 2 sprays into both nostrils as needed.  09/07/15   Ezekiel Slocumb, PA-C  ondansetron (ZOFRAN ODT) 4 MG disintegrating tablet Take 1 tablet (4 mg total) by mouth every 8 (eight) hours as needed for nausea or vomiting. 09/09/16   Joy, Helane Gunther, PA-C    Family History Family History  Problem Relation Age of Onset  . Hypertension Mother   . Diabetes Mother   . Hypertension Father   . Cancer Father  Social History Social History   Tobacco Use  . Smoking status: Former Smoker    Years: 2.00    Types: Cigarettes    Last attempt to quit: 06/27/2000    Years since quitting: 18.2  . Smokeless tobacco: Never Used  . Tobacco comment: social smoker  Substance Use Topics  . Alcohol use: No  . Drug use: No     Allergies   Codeine; Shellfish allergy; and Oxycodone   Review of Systems Review of Systems  Constitutional: Negative for fever.  HENT: Positive for congestion, sinus pressure and sinus pain. Negative for sore throat.   Eyes: Negative for pain, discharge, redness and visual disturbance.  Respiratory: Negative for cough and shortness of breath.   Cardiovascular:  Negative for chest pain.  Gastrointestinal: Negative for abdominal pain and vomiting.  Genitourinary: Negative for flank pain.  Musculoskeletal: Negative for neck pain and neck stiffness.  Skin: Negative for rash.  Neurological: Negative for headaches.  Hematological: Does not bruise/bleed easily.  Psychiatric/Behavioral: Negative for confusion.     Physical Exam Updated Vital Signs BP (!) 144/97 (BP Location: Left Arm)   Pulse 74   Temp 98 F (36.7 C) (Oral)   Resp 18   Ht 1.676 m (5\' 6" )   Wt 99.8 kg   SpO2 96%   BMI 35.51 kg/m   Physical Exam Vitals signs and nursing note reviewed.  Constitutional:      Appearance: Normal appearance. She is well-developed.  HENT:     Head: Atraumatic.     Comments: +right maxillary and frontal sinus tenderness. No temporal tenderness.     Right Ear: Tympanic membrane normal.     Left Ear: Tympanic membrane normal.     Nose: Congestion present.     Mouth/Throat:     Mouth: Mucous membranes are moist.  Eyes:     General: No scleral icterus.    Conjunctiva/sclera: Conjunctivae normal.     Pupils: Pupils are equal, round, and reactive to light.  Neck:     Musculoskeletal: Normal range of motion and neck supple. No neck rigidity or muscular tenderness.     Trachea: No tracheal deviation.  Cardiovascular:     Rate and Rhythm: Normal rate and regular rhythm.     Pulses: Normal pulses.     Heart sounds: Normal heart sounds. No murmur. No friction rub. No gallop.   Pulmonary:     Effort: Pulmonary effort is normal. No respiratory distress.     Breath sounds: Normal breath sounds.  Abdominal:     General: There is no distension.  Genitourinary:    Comments: No cva tenderness.  Musculoskeletal:        General: No swelling.  Skin:    General: Skin is warm and dry.     Findings: No rash.  Neurological:     Mental Status: She is alert.     Comments: Alert, speech normal. Steady gait.   Psychiatric:        Mood and Affect: Mood  normal.      ED Treatments / Results  Labs (all labs ordered are listed, but only abnormal results are displayed) Labs Reviewed - No data to display  EKG None  Radiology No results found.  Procedures Procedures (including critical care time)  Medications Ordered in ED Medications - No data to display   Initial Impression / Assessment and Plan / ED Course  I have reviewed the triage vital signs and the nursing notes.  Pertinent labs &  imaging results that were available during my care of the patient were reviewed by me and considered in my medical decision making (see chart for details).  Symptoms/exam c/w sinusitis. Pt has tried otc meds.   Confirmed no abx allergies.   rx amoxicillin.   Reviewed nursing notes and prior charts for additional history.     Final Clinical Impressions(s) / ED Diagnoses   Final diagnoses:  None    ED Discharge Orders    None       Lajean Saver, MD 09/09/18 1003

## 2018-09-09 NOTE — ED Notes (Signed)
Pt verbalized understanding of dc instructions.

## 2018-09-09 NOTE — Discharge Instructions (Signed)
It was our pleasure to provide your ER care today - we hope that you feel better.  Take antibiotic as prescribed.   Take zyrtecd or claritind as need for congestion.  Follow up with primary care doctor in a couple of weeks if symptoms fail to improve/resolve. Also follow up with primary care doctor for recheck of blood pressure as it is high today.   Return to ER if worse, other concern.

## 2018-10-16 DIAGNOSIS — R300 Dysuria: Secondary | ICD-10-CM | POA: Diagnosis not present

## 2018-10-16 DIAGNOSIS — E039 Hypothyroidism, unspecified: Secondary | ICD-10-CM | POA: Diagnosis not present

## 2018-10-16 DIAGNOSIS — J3489 Other specified disorders of nose and nasal sinuses: Secondary | ICD-10-CM | POA: Diagnosis not present

## 2018-10-16 DIAGNOSIS — E663 Overweight: Secondary | ICD-10-CM | POA: Diagnosis not present

## 2018-10-17 DIAGNOSIS — R300 Dysuria: Secondary | ICD-10-CM | POA: Diagnosis not present

## 2019-01-01 DIAGNOSIS — E663 Overweight: Secondary | ICD-10-CM | POA: Diagnosis not present

## 2019-01-01 DIAGNOSIS — E039 Hypothyroidism, unspecified: Secondary | ICD-10-CM | POA: Diagnosis not present

## 2019-01-01 DIAGNOSIS — R739 Hyperglycemia, unspecified: Secondary | ICD-10-CM | POA: Diagnosis not present

## 2019-01-18 DIAGNOSIS — R739 Hyperglycemia, unspecified: Secondary | ICD-10-CM | POA: Diagnosis not present

## 2019-01-18 DIAGNOSIS — E039 Hypothyroidism, unspecified: Secondary | ICD-10-CM | POA: Diagnosis not present

## 2019-01-18 DIAGNOSIS — E663 Overweight: Secondary | ICD-10-CM | POA: Diagnosis not present

## 2019-03-12 DIAGNOSIS — E039 Hypothyroidism, unspecified: Secondary | ICD-10-CM | POA: Diagnosis not present

## 2019-03-12 DIAGNOSIS — R739 Hyperglycemia, unspecified: Secondary | ICD-10-CM | POA: Diagnosis not present

## 2019-06-06 DIAGNOSIS — E663 Overweight: Secondary | ICD-10-CM | POA: Diagnosis not present

## 2019-06-06 DIAGNOSIS — Z0000 Encounter for general adult medical examination without abnormal findings: Secondary | ICD-10-CM | POA: Diagnosis not present

## 2019-06-06 DIAGNOSIS — Z13228 Encounter for screening for other metabolic disorders: Secondary | ICD-10-CM | POA: Diagnosis not present

## 2019-06-06 DIAGNOSIS — R739 Hyperglycemia, unspecified: Secondary | ICD-10-CM | POA: Diagnosis not present

## 2019-06-06 DIAGNOSIS — E039 Hypothyroidism, unspecified: Secondary | ICD-10-CM | POA: Diagnosis not present

## 2019-06-06 DIAGNOSIS — Z13220 Encounter for screening for lipoid disorders: Secondary | ICD-10-CM | POA: Diagnosis not present

## 2019-06-06 DIAGNOSIS — Z01419 Encounter for gynecological examination (general) (routine) without abnormal findings: Secondary | ICD-10-CM | POA: Diagnosis not present

## 2019-06-11 ENCOUNTER — Other Ambulatory Visit: Payer: Self-pay | Admitting: Family Medicine

## 2019-06-11 DIAGNOSIS — Z1231 Encounter for screening mammogram for malignant neoplasm of breast: Secondary | ICD-10-CM

## 2019-06-18 ENCOUNTER — Ambulatory Visit: Payer: 59 | Attending: Internal Medicine

## 2019-06-18 DIAGNOSIS — Z20822 Contact with and (suspected) exposure to covid-19: Secondary | ICD-10-CM

## 2019-06-18 DIAGNOSIS — Z20828 Contact with and (suspected) exposure to other viral communicable diseases: Secondary | ICD-10-CM | POA: Diagnosis not present

## 2019-06-20 LAB — NOVEL CORONAVIRUS, NAA: SARS-CoV-2, NAA: NOT DETECTED

## 2019-07-09 DIAGNOSIS — Z0100 Encounter for examination of eyes and vision without abnormal findings: Secondary | ICD-10-CM | POA: Diagnosis not present

## 2019-07-12 DIAGNOSIS — M25551 Pain in right hip: Secondary | ICD-10-CM | POA: Diagnosis not present

## 2019-07-12 DIAGNOSIS — M79671 Pain in right foot: Secondary | ICD-10-CM | POA: Diagnosis not present

## 2019-07-30 ENCOUNTER — Other Ambulatory Visit: Payer: Self-pay

## 2019-07-30 ENCOUNTER — Ambulatory Visit
Admission: RE | Admit: 2019-07-30 | Discharge: 2019-07-30 | Disposition: A | Discharge status: Home / Self Care | Payer: 59 | Source: Ambulatory Visit | Attending: Family Medicine | Admitting: Family Medicine

## 2019-07-30 DIAGNOSIS — Z1231 Encounter for screening mammogram for malignant neoplasm of breast: Secondary | ICD-10-CM

## 2019-08-19 DIAGNOSIS — M79671 Pain in right foot: Secondary | ICD-10-CM | POA: Diagnosis not present

## 2019-08-28 DIAGNOSIS — M79671 Pain in right foot: Secondary | ICD-10-CM | POA: Diagnosis not present

## 2019-09-16 DIAGNOSIS — M722 Plantar fascial fibromatosis: Secondary | ICD-10-CM | POA: Diagnosis not present

## 2019-09-19 DIAGNOSIS — R1030 Lower abdominal pain, unspecified: Secondary | ICD-10-CM | POA: Diagnosis not present

## 2019-09-19 DIAGNOSIS — E663 Overweight: Secondary | ICD-10-CM | POA: Diagnosis not present

## 2019-09-19 DIAGNOSIS — E7800 Pure hypercholesterolemia, unspecified: Secondary | ICD-10-CM | POA: Diagnosis not present

## 2019-09-19 DIAGNOSIS — E039 Hypothyroidism, unspecified: Secondary | ICD-10-CM | POA: Diagnosis not present

## 2019-09-19 DIAGNOSIS — R739 Hyperglycemia, unspecified: Secondary | ICD-10-CM | POA: Diagnosis not present

## 2019-10-21 ENCOUNTER — Ambulatory Visit: Payer: 59 | Attending: Internal Medicine

## 2019-10-21 DIAGNOSIS — Z20822 Contact with and (suspected) exposure to covid-19: Secondary | ICD-10-CM | POA: Diagnosis not present

## 2019-10-22 LAB — NOVEL CORONAVIRUS, NAA: SARS-CoV-2, NAA: NOT DETECTED

## 2019-10-22 LAB — SARS-COV-2, NAA 2 DAY TAT

## 2019-11-07 ENCOUNTER — Ambulatory Visit
Admission: RE | Admit: 2019-11-07 | Discharge: 2019-11-07 | Disposition: A | Discharge status: Home / Self Care | Payer: 59 | Source: Ambulatory Visit | Attending: Family Medicine | Admitting: Family Medicine

## 2019-11-07 ENCOUNTER — Other Ambulatory Visit: Payer: Self-pay | Admitting: Family Medicine

## 2019-11-07 DIAGNOSIS — R1033 Periumbilical pain: Secondary | ICD-10-CM

## 2019-11-07 DIAGNOSIS — R110 Nausea: Secondary | ICD-10-CM

## 2019-11-07 DIAGNOSIS — E663 Overweight: Secondary | ICD-10-CM | POA: Diagnosis not present

## 2019-11-07 DIAGNOSIS — R1032 Left lower quadrant pain: Secondary | ICD-10-CM | POA: Diagnosis not present

## 2019-11-07 DIAGNOSIS — E039 Hypothyroidism, unspecified: Secondary | ICD-10-CM | POA: Diagnosis not present

## 2019-11-07 DIAGNOSIS — B9681 Helicobacter pylori [H. pylori] as the cause of diseases classified elsewhere: Secondary | ICD-10-CM | POA: Diagnosis not present

## 2019-11-07 DIAGNOSIS — R1030 Lower abdominal pain, unspecified: Secondary | ICD-10-CM | POA: Diagnosis not present

## 2019-11-13 ENCOUNTER — Ambulatory Visit: Payer: 59 | Attending: Internal Medicine

## 2019-11-13 DIAGNOSIS — Z20822 Contact with and (suspected) exposure to covid-19: Secondary | ICD-10-CM

## 2019-11-14 LAB — SARS-COV-2, NAA 2 DAY TAT

## 2019-11-14 LAB — NOVEL CORONAVIRUS, NAA: SARS-CoV-2, NAA: NOT DETECTED

## 2019-11-21 DIAGNOSIS — Z1211 Encounter for screening for malignant neoplasm of colon: Secondary | ICD-10-CM | POA: Diagnosis not present

## 2019-11-21 DIAGNOSIS — K625 Hemorrhage of anus and rectum: Secondary | ICD-10-CM | POA: Diagnosis not present

## 2019-11-21 DIAGNOSIS — K5904 Chronic idiopathic constipation: Secondary | ICD-10-CM | POA: Diagnosis not present

## 2019-11-21 DIAGNOSIS — E669 Obesity, unspecified: Secondary | ICD-10-CM | POA: Diagnosis not present

## 2020-01-17 DIAGNOSIS — K635 Polyp of colon: Secondary | ICD-10-CM | POA: Diagnosis not present

## 2020-01-17 DIAGNOSIS — Z1211 Encounter for screening for malignant neoplasm of colon: Secondary | ICD-10-CM | POA: Diagnosis not present

## 2020-01-17 DIAGNOSIS — K529 Noninfective gastroenteritis and colitis, unspecified: Secondary | ICD-10-CM | POA: Diagnosis not present

## 2020-01-17 DIAGNOSIS — K625 Hemorrhage of anus and rectum: Secondary | ICD-10-CM | POA: Diagnosis not present

## 2020-01-17 DIAGNOSIS — K6389 Other specified diseases of intestine: Secondary | ICD-10-CM | POA: Diagnosis not present

## 2020-01-17 DIAGNOSIS — K5150 Left sided colitis without complications: Secondary | ICD-10-CM | POA: Diagnosis not present

## 2020-01-17 DIAGNOSIS — D120 Benign neoplasm of cecum: Secondary | ICD-10-CM | POA: Diagnosis not present

## 2020-01-17 DIAGNOSIS — K6289 Other specified diseases of anus and rectum: Secondary | ICD-10-CM | POA: Diagnosis not present

## 2020-02-11 DIAGNOSIS — E669 Obesity, unspecified: Secondary | ICD-10-CM | POA: Diagnosis not present

## 2020-02-11 DIAGNOSIS — K5904 Chronic idiopathic constipation: Secondary | ICD-10-CM | POA: Diagnosis not present

## 2020-02-11 DIAGNOSIS — K5150 Left sided colitis without complications: Secondary | ICD-10-CM | POA: Diagnosis not present

## 2020-03-12 DIAGNOSIS — R7303 Prediabetes: Secondary | ICD-10-CM | POA: Diagnosis not present

## 2020-03-12 DIAGNOSIS — M79671 Pain in right foot: Secondary | ICD-10-CM | POA: Diagnosis not present

## 2020-03-12 DIAGNOSIS — E039 Hypothyroidism, unspecified: Secondary | ICD-10-CM | POA: Diagnosis not present

## 2020-03-12 DIAGNOSIS — E663 Overweight: Secondary | ICD-10-CM | POA: Diagnosis not present

## 2020-03-12 DIAGNOSIS — M25561 Pain in right knee: Secondary | ICD-10-CM | POA: Diagnosis not present

## 2020-03-12 DIAGNOSIS — M25551 Pain in right hip: Secondary | ICD-10-CM | POA: Diagnosis not present

## 2020-04-27 DIAGNOSIS — M25522 Pain in left elbow: Secondary | ICD-10-CM | POA: Diagnosis not present

## 2020-06-04 DIAGNOSIS — M79671 Pain in right foot: Secondary | ICD-10-CM | POA: Diagnosis not present

## 2020-07-08 ENCOUNTER — Other Ambulatory Visit: Payer: Self-pay | Admitting: Family Medicine

## 2020-07-08 DIAGNOSIS — Z1231 Encounter for screening mammogram for malignant neoplasm of breast: Secondary | ICD-10-CM

## 2020-07-15 DIAGNOSIS — E663 Overweight: Secondary | ICD-10-CM | POA: Diagnosis not present

## 2020-07-15 DIAGNOSIS — J040 Acute laryngitis: Secondary | ICD-10-CM | POA: Diagnosis not present

## 2020-07-15 DIAGNOSIS — J069 Acute upper respiratory infection, unspecified: Secondary | ICD-10-CM | POA: Diagnosis not present

## 2020-07-15 DIAGNOSIS — R519 Headache, unspecified: Secondary | ICD-10-CM | POA: Diagnosis not present

## 2020-07-15 DIAGNOSIS — E039 Hypothyroidism, unspecified: Secondary | ICD-10-CM | POA: Diagnosis not present

## 2020-08-20 ENCOUNTER — Other Ambulatory Visit: Payer: Self-pay

## 2020-08-20 ENCOUNTER — Ambulatory Visit
Admission: RE | Admit: 2020-08-20 | Discharge: 2020-08-20 | Disposition: A | Discharge status: Home / Self Care | Payer: 59 | Source: Ambulatory Visit | Attending: Family Medicine | Admitting: Family Medicine

## 2020-08-20 DIAGNOSIS — Z1231 Encounter for screening mammogram for malignant neoplasm of breast: Secondary | ICD-10-CM

## 2020-08-24 DIAGNOSIS — R030 Elevated blood-pressure reading, without diagnosis of hypertension: Secondary | ICD-10-CM | POA: Diagnosis not present

## 2020-08-24 DIAGNOSIS — R739 Hyperglycemia, unspecified: Secondary | ICD-10-CM | POA: Diagnosis not present

## 2020-08-24 DIAGNOSIS — E039 Hypothyroidism, unspecified: Secondary | ICD-10-CM | POA: Diagnosis not present

## 2020-09-01 DIAGNOSIS — M25561 Pain in right knee: Secondary | ICD-10-CM | POA: Diagnosis not present

## 2020-09-08 DIAGNOSIS — Z0000 Encounter for general adult medical examination without abnormal findings: Secondary | ICD-10-CM | POA: Diagnosis not present

## 2020-09-08 DIAGNOSIS — Z01419 Encounter for gynecological examination (general) (routine) without abnormal findings: Secondary | ICD-10-CM | POA: Diagnosis not present

## 2020-09-09 DIAGNOSIS — Z0100 Encounter for examination of eyes and vision without abnormal findings: Secondary | ICD-10-CM | POA: Diagnosis not present

## 2020-09-14 ENCOUNTER — Other Ambulatory Visit: Payer: Self-pay | Admitting: Family Medicine

## 2020-09-18 DIAGNOSIS — M1711 Unilateral primary osteoarthritis, right knee: Secondary | ICD-10-CM | POA: Diagnosis not present

## 2020-09-24 NOTE — Patient Instructions (Addendum)
DUE TO COVID-19 ONLY ONE VISITOR IS ALLOWED TO COME WITH YOU AND STAY IN THE WAITING ROOM ONLY DURING PRE OP AND PROCEDURE DAY OF SURGERY. THE 1 VISITOR  MAY VISIT WITH YOU AFTER SURGERY IN YOUR PRIVATE ROOM DURING VISITING HOURS ONLY!  YOU NEED TO HAVE A COVID 19 TEST ON_4/8______ @__2 :30____, THIS TEST MUST BE DONE BEFORE SURGERY,  COVID TESTING SITE [place] [address], IT IS ON THE RIGHT GOING OUT [address] APPROXIMATELY  2 MINUTES PAST [employer] ON THE RIGHT. ONCE YOUR COVID TEST IS COMPLETED,  PLEASE BEGIN THE QUARANTINE INSTRUCTIONS AS OUTLINED IN YOUR HANDOUT.                [name]    Your procedure is scheduled on: 10/06/20   Report to [facility] Main  Entrance   Report to admitting at  7:35 AM     Call this number if you have problems the morning of surgery [place], NO CHEWING GUM [place].  No food after midnight.    You may have clear liquid until 7:00 AM.    At 6:30 AM drink pre surgery drink  . Nothing by mouth after 7:00 AM.    Take these medicines the morning of surgery with A SIP OF WATER: Levothyroxine                                 You may not have any metal on your body including hair pins and              piercings  Do not wear jewelry, make-up, lotions, powders or perfumes, deodorant             Do not wear nail polish on your fingernails.  Do not shave  48 hours prior to surgery.              Men may shave face and neck.   Do not bring valuables to the hospital. [place].  Contacts, dentures or bridgework may not be worn into surgery.       Name and phone number of your driver:  Special Instructions: N/A              Please read over the following fact sheets you were given: _____________________________________________________________________              [facility] - Preparing for Surgery Before surgery, you can play an important role.  Because skin is not sterile, your skin needs to be as free of germs as possible.  You can reduce the number of germs on your skin by washing with CHG (chlorahexidine gluconate) soap before surgery.  CHG is an antiseptic cleaner which kills germs and bonds with the skin to continue killing germs even after washing. Please DO NOT use if you have an allergy to CHG or antibacterial soaps.  If your skin becomes reddened/irritated stop using the CHG and inform your nurse when you arrive at Short Stay. Do not shave (including legs and underarms) for at least 48 hours prior to the first CHG shower.    Please follow these instructions carefully:  1.  Shower with CHG Soap the night before  surgery and the  morning of Surgery.  2.  If you choose to wash your hair, wash your hair first as usual with your  normal  shampoo.  3.  After you shampoo, rinse your hair and body thoroughly to remove the  shampoo.                                        4.  Use CHG as you would any other liquid soap.  You can apply chg directly  to the skin and wash                       Gently with a scrungie or clean washcloth.  5.  Apply the CHG Soap to your body ONLY FROM THE NECK DOWN.   Do not use on face/ open                           Wound or open sores. Avoid contact with eyes, ears mouth and genitals (private parts).                       Wash face,  Genitals (private parts) with your normal soap.             6.  Wash thoroughly, paying special attention to the area where your surgery  will be performed.  7.  Thoroughly rinse your body with warm water from the neck down.  8.  DO NOT shower/wash with your normal soap after using and rinsing off  the CHG Soap.             9.  Pat yourself dry with a clean towel.            10.  Wear clean pajamas.            11.  Place clean sheets on your bed the night of your first shower and do not  sleep  with pets. Day of Surgery : Do not apply any lotions/deodorants the morning of surgery.  Please wear clean clothes to the hospital/surgery center.  FAILURE TO FOLLOW THESE INSTRUCTIONS MAY RESULT IN THE CANCELLATION OF YOUR SURGERY PATIENT SIGNATURE_________________________________  NURSE SIGNATURE__________________________________  ________________________________________________________________________   [name]  An incentive spirometer is a tool that can help keep your lungs clear and active. This tool measures how well you are filling your lungs with each breath. Taking long deep breaths may help reverse or decrease the chance of developing breathing (pulmonary) problems (especially infection) following:  A long period of time when you are unable to move or be active. BEFORE THE PROCEDURE   If the spirometer includes an indicator to show your best effort, your nurse or respiratory therapist will set it to a desired goal.  If possible, sit up straight or lean slightly forward. Try not to slouch.  Hold the incentive spirometer in an upright position. INSTRUCTIONS FOR USE  1. Sit on the edge of your bed if possible, or sit up as far as you can in bed or on a chair. 2. Hold the incentive spirometer in an upright position. 3. Breathe out normally. 4. Place the mouthpiece in your mouth and seal your lips tightly around it. 5. Breathe in slowly and as deeply as possible, raising the piston or the ball toward the top of the  column. 6. Hold your breath for 3-5 seconds or for as long as possible. Allow the piston or ball to fall to the bottom of the column. 7. Remove the mouthpiece from your mouth and breathe out normally. 8. Rest for a few seconds and repeat Steps 1 through 7 at least 10 times every 1-2 hours when you are awake. Take your time and take a few normal breaths between deep breaths. 9. The spirometer may include an indicator to show your best effort. Use the  indicator as a goal to work toward during each repetition. 10. After each set of 10 deep breaths, practice coughing to be sure your lungs are clear. If you have an incision (the cut made at the time of surgery), support your incision when coughing by placing a pillow or rolled up towels firmly against it. Once you are able to get out of bed, walk around indoors and cough well. You may stop using the incentive spirometer when instructed by your caregiver.  RISKS AND COMPLICATIONS  Take your time so you do not get dizzy or light-headed.  If you are in pain, you may need to take or ask for pain medication before doing incentive spirometry. It is harder to take a deep breath if you are having pain. AFTER USE  Rest and breathe slowly and easily.  It can be helpful to keep track of a log of your progress. Your caregiver can provide you with a simple table to help with this. If you are using the spirometer at home, follow these instructions: [place] IF:   You are having difficultly using the spirometer.  You have trouble using the spirometer as often as instructed.  Your pain medication is not giving enough relief while using the spirometer.  You develop fever of 100.5 F (38.1 C) or higher. SEEK IMMEDIATE MEDICAL CARE IF:   You cough up bloody sputum that had not been present before.  You develop fever of 102 F (38.9 C) or greater.  You develop worsening pain at or near the incision site. MAKE SURE YOU:   Understand these instructions.  Will watch your condition.  Will get help right away if you are not doing well or get worse. Document Released: 10/24/2006 Document Revised: 09/05/2011 Document Reviewed: 12/25/2006 [facility] Patient Information 2014 [place], [place].   ________________________________________________________________________

## 2020-09-28 NOTE — H&P (Signed)
TOTAL KNEE ADMISSION H&P  Patient is being admitted for right total knee arthroplasty.  Subjective:  Chief Complaint:right knee pain.  HPI: [name], 55 y.o. female, has a history of pain and functional disability in the right knee due to arthritis and has failed non-surgical conservative treatments for greater than 12 weeks to includecorticosteriod injections, viscosupplementation injections and activity modification.  Onset of symptoms was gradual, starting 2 years ago with gradually worsening course since that time. The patient noted no past surgery on the right knee(s).  Patient currently rates pain in the right knee(s) at 7 out of 10 with activity. Patient has worsening of pain with activity and weight bearing, pain that interferes with activities of daily living and pain with passive range of motion.  Patient has evidence of joint space narrowing by imaging studies. There is no active infection.  Patient Active Problem List   Diagnosis Date Noted  . Neuralgia involving scalp 11/13/2017  . Expected blood loss anemia 03/05/2013  . Obese 03/05/2013  . S/P left TH revision 03/04/2013  . Hypothyroid 08/03/2011   Past Medical History:  Diagnosis Date  . Allergy   . Anemia    "younger days"  . Arthritis   . Headache(784.0) 6 years ago  . History of blood transfusion sept 2014   1 unit  . Thyroid disease    Hypothyroid on replacement therapy    Past Surgical History:  Procedure Laterality Date  . [place]  . ENDOMETRIAL ABLATION    . JOINT REPLACEMENT     Left hip replaced in 2006  . TOTAL HIP REVISION Left 03/04/2013   Procedure: LEFT TOTAL HIP REVISION ;  Surgeon: Mauri Pole, MD;  Location: [facility];  Service: Orthopedics;  Laterality: Left;  . TOTAL HIP REVISION Left 11/25/2013   Procedure: LEFT HIP EXCISIONAL AND NON EXCISIONAL DEBRIDEMENT ACESS LEFT HIP COMPONENT ;  Surgeon: Mauri Pole, MD;  Location: [facility];  Service: Orthopedics;   Laterality: Left;  . TUBAL LIGATION      No current facility-administered medications for this encounter.   Current Outpatient Medications  Medication Sig Dispense Refill Last Dose  . aspirin EC 81 MG tablet Take 81 mg by mouth daily. Swallow whole.     Marland Kitchen aspirin-acetaminophen-caffeine (EXCEDRIN MIGRAINE) 250-250-65 MG tablet Take 2 tablets by mouth every 6 (six) hours as needed for headache.     . levothyroxine (SYNTHROID) 112 MCG tablet Take 112 mcg by mouth daily before breakfast.      Allergies  Allergen Reactions  . Codeine Shortness Of Breath, Nausea And Vomiting and Palpitations  . Shellfish Allergy Swelling  . Oxycodone Itching and Palpitations    Social History   Tobacco Use  . Smoking status: Former Smoker    Years: 2.00    Types: Cigarettes    Quit date: 06/27/2000    Years since quitting: 20.2  . Smokeless tobacco: Never Used  . Tobacco comment: social smoker  Substance Use Topics  . Alcohol use: No    Family History  Problem Relation Age of Onset  . Hypertension Mother   . Diabetes Mother   . Hypertension Father   . Cancer Father      Review of Systems  Constitutional: Negative for chills and fever.  Respiratory: Negative for cough and shortness of breath.   Cardiovascular: Negative for chest pain.  Gastrointestinal: Negative for nausea and vomiting.  Musculoskeletal: Positive for arthralgias.    Objective:  Physical Exam Well  nourished and well developed. General: Alert and oriented x3, cooperative and pleasant, no acute distress. Head: normocephalic, atraumatic, neck supple. Eyes: EOMI.  Musculoskeletal: Right knee exam: No palpable effusion, warmth erythema Slight genu varum with tenderness mainly of the medial and anterior aspect the knee Stable ligaments medially and laterally No lower extremity edema or erythema  Calves soft and nontender. Motor function intact in LE. Strength 5/5 LE bilaterally. Neuro: Distal pulses 2+. Sensation to  light touch intact in LE. Vital signs in last 24 hours:    Labs:   Estimated body mass index is 37.97 kg/m as calculated from the following:   Height as of 09/24/20: 6' (1.829 m).   Weight as of 09/24/20: 127 kg.   Imaging Review Plain radiographs demonstrate severe degenerative joint disease of the right knee(s). The overall alignment isneutral. The bone quality appears to be adequate for age and reported activity level.      Assessment/Plan:  End stage arthritis, right knee   The patient history, physical examination, clinical judgment of the provider and imaging studies are consistent with end stage degenerative joint disease of the right knee(s) and total knee arthroplasty is deemed medically necessary. The treatment options including medical management, injection therapy arthroscopy and arthroplasty were discussed at length. The risks and benefits of total knee arthroplasty were presented and reviewed. The risks due to aseptic loosening, infection, stiffness, patella tracking problems, thromboembolic complications and other imponderables were discussed. The patient acknowledged the explanation, agreed to proceed with the plan and consent was signed. Patient is being admitted for inpatient treatment for surgery, pain control, PT, OT, prophylactic antibiotics, VTE prophylaxis, progressive ambulation and ADL's and discharge planning. The patient is planning to be discharged home.  Therapy Plans: outpatient [facility] at [facility] Disposition: Home with daugher & son Planned DVT Prophylaxis: aspirin 81mg  BID DME needed: walker, 3-n-1 PCP: Dr. Lin Landsman, clearance received TXA: IV Allergies: codeine - chest tightness, difficulty breathing , shellfish - chest tightness, difficulty breathing Anesthesia Concerns: none BMI: 35 Not diabetic.  Other: - Unsure of any pain meds she can tolerate, will likely try Tramadol, Tylenol, Celebrex, & maybe Dilaudid for severe pain   Patient's  anticipated LOS is less than 2 midnights, meeting these requirements: - Younger than 26 - Lives within 1 hour of care - Has a competent adult at home to recover with post-op recover - NO history of  - Chronic pain requiring opiods  - Diabetes  - Coronary Artery Disease  - Heart failure  - Heart attack  - Stroke  - DVT/VTE  - Cardiac arrhythmia  - Respiratory Failure/COPD  - Renal failure  - Anemia  - Advanced Liver disease   Griffith Citron, PA-C Orthopedic Surgery [facility] [place] [phone]

## 2020-09-29 ENCOUNTER — Encounter (HOSPITAL_COMMUNITY)
Admission: RE | Admit: 2020-09-29 | Discharge: 2020-09-29 | Disposition: A | Discharge status: Home / Self Care | Payer: 59 | Source: Ambulatory Visit | Attending: Orthopedic Surgery | Admitting: Orthopedic Surgery

## 2020-09-29 ENCOUNTER — Other Ambulatory Visit: Payer: Self-pay

## 2020-09-29 ENCOUNTER — Encounter (HOSPITAL_COMMUNITY): Payer: Self-pay

## 2020-09-29 DIAGNOSIS — Z01818 Encounter for other preprocedural examination: Secondary | ICD-10-CM | POA: Diagnosis not present

## 2020-09-29 HISTORY — DX: Hypothyroidism, unspecified: E03.9

## 2020-09-29 LAB — COMPREHENSIVE METABOLIC PANEL
ALT: 13 U/L (ref 0–44)
AST: 23 U/L (ref 15–41)
Albumin: 3.7 g/dL (ref 3.5–5.0)
Alkaline Phosphatase: 51 U/L (ref 38–126)
Anion gap: 7 (ref 5–15)
BUN: 8 mg/dL (ref 6–20)
CO2: 27 mmol/L (ref 22–32)
Calcium: 9.5 mg/dL (ref 8.9–10.3)
Chloride: 106 mmol/L (ref 98–111)
Creatinine, Ser: 0.84 mg/dL (ref 0.44–1.00)
GFR, Estimated: >60 mL/min (ref >60)
Glucose, Bld: 148 mg/dL — ABNORMAL HIGH (ref 70–99)
Potassium: 4.7 mmol/L (ref 3.5–5.1)
Sodium: 140 mmol/L (ref 135–145)
Total Bilirubin: 0.6 mg/dL (ref 0.3–1.2)
Total Protein: 7.9 g/dL (ref 6.5–8.1)

## 2020-09-29 LAB — CBC
HCT: 40.8 % (ref 36.0–46.0)
Hemoglobin: 13.1 g/dL (ref 12.0–15.0)
MCH: 29.7 pg (ref 26.0–34.0)
MCHC: 32.1 g/dL (ref 30.0–36.0)
MCV: 92.5 fL (ref 80.0–100.0)
Platelets: 350 10*3/uL (ref 150–400)
RBC: 4.41 MIL/uL (ref 3.87–5.11)
RDW: 12.4 % (ref 11.5–15.5)
WBC: 7.1 10*3/uL (ref 4.0–10.5)
nRBC: 0 % (ref 0.0–0.2)

## 2020-09-29 LAB — PROTIME-INR
INR: 1.1 (ref 0.8–1.2)
Prothrombin Time: 13.4 seconds (ref 11.4–15.2)

## 2020-09-29 LAB — SURGICAL PCR SCREEN
MRSA, PCR: NEGATIVE
Staphylococcus aureus: NEGATIVE

## 2020-09-29 LAB — APTT: aPTT: 28 seconds (ref 24–36)

## 2020-09-29 NOTE — Progress Notes (Signed)
COVID Vaccine Completed:Yes Date COVID Vaccine completed:04/27/20 COVID vaccine manufacturer: Pfizer      PCP - Dr. Chauncey Reading Cardiologist - none  Chest x-ray - no EKG - 09/29/20-chart, epic Stress Test - no ECHO - 2008 Cardiac Cath - no Pacemaker/ICD device last checked:NA  Sleep Study - no CPAP -   Fasting Blood Sugar - NA Checks Blood Sugar _____ times a day  Blood Thinner Instructions:ASA81/ Dr. Ayesha Rumpf Aspirin Instructions:Stop 7 days prior to DOS/ Dr. Alvan Dame Last Dose:09/28/20  Anesthesia review:   Patient denies shortness of breath, fever, cough and chest pain at PAT appointment yes  Patient verbalized understanding of instructions that were given to them at the PAT appointment. Patient was also instructed that they will need to review over the PAT instructions again at home before surgery.yes Pt reports no SOB with any activities.

## 2020-10-02 ENCOUNTER — Other Ambulatory Visit (HOSPITAL_COMMUNITY)
Admission: RE | Admit: 2020-10-02 | Discharge: 2020-10-02 | Disposition: A | Discharge status: Home / Self Care | Payer: 59 | Source: Ambulatory Visit | Attending: Orthopedic Surgery | Admitting: Orthopedic Surgery

## 2020-10-02 DIAGNOSIS — Z20822 Contact with and (suspected) exposure to covid-19: Secondary | ICD-10-CM | POA: Diagnosis not present

## 2020-10-02 DIAGNOSIS — Z01812 Encounter for preprocedural laboratory examination: Secondary | ICD-10-CM | POA: Insufficient documentation

## 2020-10-02 LAB — SARS CORONAVIRUS 2 (TAT 6-24 HRS): SARS Coronavirus 2: NEGATIVE

## 2020-10-05 ENCOUNTER — Encounter (HOSPITAL_COMMUNITY): Payer: Self-pay | Admitting: Orthopedic Surgery

## 2020-10-06 ENCOUNTER — Encounter (HOSPITAL_COMMUNITY): Payer: Self-pay | Admitting: Orthopedic Surgery

## 2020-10-06 ENCOUNTER — Ambulatory Visit (HOSPITAL_COMMUNITY): Payer: 59 | Admitting: Anesthesiology

## 2020-10-06 ENCOUNTER — Encounter (HOSPITAL_COMMUNITY)
Admission: RE | Disposition: A | Discharge status: Home / Self Care | Payer: Self-pay | Source: Home / Self Care | Attending: Orthopedic Surgery

## 2020-10-06 ENCOUNTER — Observation Stay (HOSPITAL_COMMUNITY)
Admission: RE | Admit: 2020-10-06 | Discharge: 2020-10-07 | Disposition: A | Discharge status: Home / Self Care | Payer: 59 | Attending: Orthopedic Surgery | Admitting: Orthopedic Surgery

## 2020-10-06 ENCOUNTER — Other Ambulatory Visit: Payer: Self-pay

## 2020-10-06 DIAGNOSIS — Z96651 Presence of right artificial knee joint: Secondary | ICD-10-CM

## 2020-10-06 DIAGNOSIS — Z96642 Presence of left artificial hip joint: Secondary | ICD-10-CM | POA: Diagnosis not present

## 2020-10-06 DIAGNOSIS — Z7982 Long term (current) use of aspirin: Secondary | ICD-10-CM | POA: Insufficient documentation

## 2020-10-06 DIAGNOSIS — Z79899 Other long term (current) drug therapy: Secondary | ICD-10-CM | POA: Diagnosis not present

## 2020-10-06 DIAGNOSIS — G8918 Other acute postprocedural pain: Secondary | ICD-10-CM | POA: Diagnosis not present

## 2020-10-06 DIAGNOSIS — M1711 Unilateral primary osteoarthritis, right knee: Secondary | ICD-10-CM | POA: Diagnosis not present

## 2020-10-06 DIAGNOSIS — E039 Hypothyroidism, unspecified: Secondary | ICD-10-CM | POA: Insufficient documentation

## 2020-10-06 DIAGNOSIS — Z87891 Personal history of nicotine dependence: Secondary | ICD-10-CM | POA: Insufficient documentation

## 2020-10-06 HISTORY — PX: TOTAL KNEE ARTHROPLASTY: SHX125

## 2020-10-06 LAB — TYPE AND SCREEN
ABO/RH(D): A POS
Antibody Screen: NEGATIVE

## 2020-10-06 LAB — PREGNANCY, URINE: Preg Test, Ur: NEGATIVE

## 2020-10-06 SURGERY — ARTHROPLASTY, KNEE, TOTAL
Anesthesia: Monitor Anesthesia Care | Site: Knee | Laterality: Right

## 2020-10-06 MED ORDER — BISACODYL 10 MG RE SUPP: 10.0000 mg | Freq: Every day | RECTAL | Status: DC | PRN

## 2020-10-06 MED ORDER — ONDANSETRON HCL 4 MG/2ML IJ SOLN
INTRAMUSCULAR | Status: DC | PRN
  Administered 2020-10-06: 4 mg via INTRAVENOUS

## 2020-10-06 MED ORDER — EPHEDRINE SULFATE-NACL 50-0.9 MG/10ML-% IV SOSY
PREFILLED_SYRINGE | INTRAVENOUS | Status: DC | PRN
  Administered 2020-10-06 (×2): 5 mg via INTRAVENOUS

## 2020-10-06 MED ORDER — BUPIVACAINE-EPINEPHRINE (PF) 0.25% -1:200000 IJ SOLN
INTRAMUSCULAR | Status: DC | PRN
  Administered 2020-10-06: 30 mL

## 2020-10-06 MED ORDER — PROPOFOL 10 MG/ML IV BOLUS
INTRAVENOUS | Status: AC
  Filled 2020-10-06: qty 20

## 2020-10-06 MED ORDER — DEXAMETHASONE SODIUM PHOSPHATE 10 MG/ML IJ SOLN
INTRAMUSCULAR | Status: DC | PRN
  Administered 2020-10-06: 8 mg via INTRAVENOUS

## 2020-10-06 MED ORDER — HYDROMORPHONE HCL 1 MG/ML IJ SOLN
0.5000 mg | INTRAMUSCULAR | Status: DC | PRN
Start: 2020-10-06 — End: 2020-10-07

## 2020-10-06 MED ORDER — KETOROLAC TROMETHAMINE 30 MG/ML IJ SOLN
INTRAMUSCULAR | Status: DC | PRN
  Administered 2020-10-06: 30 mg

## 2020-10-06 MED ORDER — ROCURONIUM BROMIDE 10 MG/ML (PF) SYRINGE
PREFILLED_SYRINGE | INTRAVENOUS | Status: AC
  Filled 2020-10-06: qty 10

## 2020-10-06 MED ORDER — METHOCARBAMOL 500 MG PO TABS
500.0000 mg | ORAL_TABLET | Freq: Four times a day (QID) | ORAL | Status: DC | PRN
  Administered 2020-10-06: 500 mg via ORAL
  Filled 2020-10-06: qty 1

## 2020-10-06 MED ORDER — CHLORHEXIDINE GLUCONATE 0.12 % MT SOLN
15.0000 mL | Freq: Once | OROMUCOSAL | Status: AC
  Administered 2020-10-06: 15 mL via OROMUCOSAL

## 2020-10-06 MED ORDER — TRANEXAMIC ACID-NACL 1000-0.7 MG/100ML-% IV SOLN
1000.0000 mg | Freq: Once | INTRAVENOUS | Status: AC
  Administered 2020-10-06: 1000 mg via INTRAVENOUS
  Filled 2020-10-06: qty 100

## 2020-10-06 MED ORDER — TRANEXAMIC ACID-NACL 1000-0.7 MG/100ML-% IV SOLN
1000.0000 mg | INTRAVENOUS | Status: AC
  Administered 2020-10-06: 1000 mg via INTRAVENOUS
  Filled 2020-10-06: qty 100

## 2020-10-06 MED ORDER — PROPOFOL 10 MG/ML IV BOLUS
INTRAVENOUS | Status: DC | PRN
  Administered 2020-10-06 (×2): 30 mg via INTRAVENOUS
  Administered 2020-10-06: 20 mg via INTRAVENOUS

## 2020-10-06 MED ORDER — PROPOFOL 500 MG/50ML IV EMUL
INTRAVENOUS | Status: AC
  Filled 2020-10-06: qty 50

## 2020-10-06 MED ORDER — BUPIVACAINE IN DEXTROSE 0.75-8.25 % IT SOLN
INTRATHECAL | Status: DC | PRN
  Administered 2020-10-06: 1.6 mL via INTRATHECAL

## 2020-10-06 MED ORDER — LACTATED RINGERS IV SOLN: INTRAVENOUS | Status: DC

## 2020-10-06 MED ORDER — EPHEDRINE 5 MG/ML INJ
INTRAVENOUS | Status: AC
  Filled 2020-10-06: qty 10

## 2020-10-06 MED ORDER — DEXAMETHASONE SODIUM PHOSPHATE 10 MG/ML IJ SOLN
10.0000 mg | Freq: Once | INTRAMUSCULAR | Status: AC
  Administered 2020-10-07: 10 mg via INTRAVENOUS
  Filled 2020-10-06: qty 1

## 2020-10-06 MED ORDER — PHENYLEPHRINE 40 MCG/ML (10ML) SYRINGE FOR IV PUSH (FOR BLOOD PRESSURE SUPPORT)
PREFILLED_SYRINGE | INTRAVENOUS | Status: DC | PRN
  Administered 2020-10-06 (×2): 40 ug via INTRAVENOUS
  Administered 2020-10-06: 80 ug via INTRAVENOUS
  Administered 2020-10-06 (×2): 40 ug via INTRAVENOUS

## 2020-10-06 MED ORDER — MIDAZOLAM HCL 2 MG/2ML IJ SOLN
1.0000 mg | INTRAMUSCULAR | Status: AC
  Administered 2020-10-06: 2 mg via INTRAVENOUS
  Filled 2020-10-06: qty 2

## 2020-10-06 MED ORDER — FENTANYL CITRATE (PF) 100 MCG/2ML IJ SOLN
50.0000 ug | INTRAMUSCULAR | Status: AC
  Administered 2020-10-06: 100 ug via INTRAVENOUS
  Filled 2020-10-06: qty 2

## 2020-10-06 MED ORDER — MENTHOL 3 MG MT LOZG: 1.0000 | LOZENGE | OROMUCOSAL | Status: DC | PRN

## 2020-10-06 MED ORDER — ONDANSETRON HCL 4 MG/2ML IJ SOLN: 4.0000 mg | Freq: Four times a day (QID) | INTRAMUSCULAR | Status: DC | PRN

## 2020-10-06 MED ORDER — ACETAMINOPHEN 160 MG/5ML PO SOLN: 325.0000 mg | ORAL | Status: DC | PRN

## 2020-10-06 MED ORDER — ORAL CARE MOUTH RINSE: 15.0000 mL | Freq: Once | OROMUCOSAL | Status: AC

## 2020-10-06 MED ORDER — PHENYLEPHRINE 40 MCG/ML (10ML) SYRINGE FOR IV PUSH (FOR BLOOD PRESSURE SUPPORT)
PREFILLED_SYRINGE | INTRAVENOUS | Status: AC
  Filled 2020-10-06: qty 10

## 2020-10-06 MED ORDER — OXYCODONE HCL 5 MG PO TABS: 5.0000 mg | ORAL_TABLET | Freq: Once | ORAL | Status: DC | PRN

## 2020-10-06 MED ORDER — ASPIRIN 81 MG PO CHEW
81.0000 mg | CHEWABLE_TABLET | Freq: Two times a day (BID) | ORAL | Status: DC
  Administered 2020-10-06 – 2020-10-07 (×2): 81 mg via ORAL
  Filled 2020-10-06 (×2): qty 1

## 2020-10-06 MED ORDER — POVIDONE-IODINE 10 % EX SWAB
2.0000 "application " | Freq: Once | CUTANEOUS | Status: AC
  Administered 2020-10-06: 2 via TOPICAL

## 2020-10-06 MED ORDER — SODIUM CHLORIDE (PF) 0.9 % IJ SOLN
INTRAMUSCULAR | Status: AC
  Filled 2020-10-06: qty 30

## 2020-10-06 MED ORDER — METOCLOPRAMIDE HCL 5 MG/ML IJ SOLN
5.0000 mg | Freq: Three times a day (TID) | INTRAMUSCULAR | Status: DC | PRN
Start: 2020-10-06 — End: 2020-10-07

## 2020-10-06 MED ORDER — PHENOL 1.4 % MT LIQD: 1.0000 | OROMUCOSAL | Status: DC | PRN

## 2020-10-06 MED ORDER — DOCUSATE SODIUM 100 MG PO CAPS
100.0000 mg | ORAL_CAPSULE | Freq: Two times a day (BID) | ORAL | Status: DC
  Administered 2020-10-06 – 2020-10-07 (×2): 100 mg via ORAL
  Filled 2020-10-06 (×2): qty 1

## 2020-10-06 MED ORDER — MEPERIDINE HCL 50 MG/ML IJ SOLN: 6.2500 mg | INTRAMUSCULAR | Status: DC | PRN

## 2020-10-06 MED ORDER — LEVOTHYROXINE SODIUM 112 MCG PO TABS
112.0000 ug | ORAL_TABLET | Freq: Every day | ORAL | Status: DC
  Administered 2020-10-07: 112 ug via ORAL
  Filled 2020-10-06: qty 1

## 2020-10-06 MED ORDER — ACETAMINOPHEN 325 MG PO TABS
325.0000 mg | ORAL_TABLET | Freq: Four times a day (QID) | ORAL | Status: DC | PRN
Start: 2020-10-07 — End: 2020-10-07

## 2020-10-06 MED ORDER — HYDROCODONE-ACETAMINOPHEN 5-325 MG PO TABS
1.0000 | ORAL_TABLET | ORAL | Status: DC | PRN
  Administered 2020-10-06 – 2020-10-07 (×2): 1 via ORAL
  Filled 2020-10-06: qty 2

## 2020-10-06 MED ORDER — PROPOFOL 500 MG/50ML IV EMUL
INTRAVENOUS | Status: DC | PRN
  Administered 2020-10-06: 75 ug/kg/min via INTRAVENOUS

## 2020-10-06 MED ORDER — DIPHENHYDRAMINE HCL 12.5 MG/5ML PO ELIX
12.5000 mg | ORAL_SOLUTION | ORAL | Status: DC | PRN
Start: 2020-10-06 — End: 2020-10-07

## 2020-10-06 MED ORDER — BUPIVACAINE-EPINEPHRINE (PF) 0.25% -1:200000 IJ SOLN
INTRAMUSCULAR | Status: AC
  Filled 2020-10-06: qty 30

## 2020-10-06 MED ORDER — SODIUM CHLORIDE (PF) 0.9 % IJ SOLN
INTRAMUSCULAR | Status: DC | PRN
  Administered 2020-10-06: 30 mL

## 2020-10-06 MED ORDER — SODIUM CHLORIDE 0.9 % IR SOLN
Status: DC | PRN
  Administered 2020-10-06: 1000 mL

## 2020-10-06 MED ORDER — DEXAMETHASONE SODIUM PHOSPHATE 10 MG/ML IJ SOLN: 8.0000 mg | Freq: Once | INTRAMUSCULAR | Status: DC

## 2020-10-06 MED ORDER — 0.9 % SODIUM CHLORIDE (POUR BTL) OPTIME
TOPICAL | Status: DC | PRN
  Administered 2020-10-06: 1000 mL

## 2020-10-06 MED ORDER — POLYETHYLENE GLYCOL 3350 17 G PO PACK: 17.0000 g | PACK | Freq: Every day | ORAL | Status: DC | PRN

## 2020-10-06 MED ORDER — STERILE WATER FOR IRRIGATION IR SOLN
Status: DC | PRN
  Administered 2020-10-06: 2000 mL

## 2020-10-06 MED ORDER — OXYCODONE HCL 5 MG/5ML PO SOLN: 5.0000 mg | Freq: Once | ORAL | Status: DC | PRN

## 2020-10-06 MED ORDER — LIDOCAINE 2% (20 MG/ML) 5 ML SYRINGE
INTRAMUSCULAR | Status: DC | PRN
  Administered 2020-10-06: 60 mg via INTRAVENOUS
  Administered 2020-10-06: 40 mg via INTRAVENOUS

## 2020-10-06 MED ORDER — TRAMADOL HCL 50 MG PO TABS: 50.0000 mg | ORAL_TABLET | Freq: Four times a day (QID) | ORAL | Status: DC | PRN

## 2020-10-06 MED ORDER — CEFAZOLIN SODIUM-DEXTROSE 2-4 GM/100ML-% IV SOLN
2.0000 g | INTRAVENOUS | Status: AC
  Administered 2020-10-06: 2 g via INTRAVENOUS
  Filled 2020-10-06: qty 100

## 2020-10-06 MED ORDER — CELECOXIB 200 MG PO CAPS
200.0000 mg | ORAL_CAPSULE | Freq: Two times a day (BID) | ORAL | Status: DC
  Administered 2020-10-06 – 2020-10-07 (×2): 200 mg via ORAL
  Filled 2020-10-06 (×2): qty 1

## 2020-10-06 MED ORDER — METOCLOPRAMIDE HCL 5 MG PO TABS: 5.0000 mg | ORAL_TABLET | Freq: Three times a day (TID) | ORAL | Status: DC | PRN

## 2020-10-06 MED ORDER — ONDANSETRON HCL 4 MG/2ML IJ SOLN: 4.0000 mg | Freq: Once | INTRAMUSCULAR | Status: DC | PRN

## 2020-10-06 MED ORDER — ROPIVACAINE HCL 7.5 MG/ML IJ SOLN
INTRAMUSCULAR | Status: DC | PRN
  Administered 2020-10-06: 25 mL via PERINEURAL
  Administered 2020-10-06: 5 mL via PERINEURAL

## 2020-10-06 MED ORDER — DEXAMETHASONE SODIUM PHOSPHATE 10 MG/ML IJ SOLN
INTRAMUSCULAR | Status: DC | PRN
  Administered 2020-10-06: 10 mg

## 2020-10-06 MED ORDER — FENTANYL CITRATE (PF) 100 MCG/2ML IJ SOLN: 25.0000 ug | INTRAMUSCULAR | Status: DC | PRN

## 2020-10-06 MED ORDER — FERROUS SULFATE 325 (65 FE) MG PO TABS
325.0000 mg | ORAL_TABLET | Freq: Three times a day (TID) | ORAL | Status: DC
  Administered 2020-10-06 – 2020-10-07 (×3): 325 mg via ORAL
  Filled 2020-10-06 (×3): qty 1

## 2020-10-06 MED ORDER — ACETAMINOPHEN 325 MG PO TABS: 325.0000 mg | ORAL_TABLET | ORAL | Status: DC | PRN

## 2020-10-06 MED ORDER — DEXMEDETOMIDINE (PRECEDEX) IN NS 20 MCG/5ML (4 MCG/ML) IV SYRINGE
PREFILLED_SYRINGE | INTRAVENOUS | Status: DC | PRN
  Administered 2020-10-06 (×2): 8 ug via INTRAVENOUS
  Administered 2020-10-06: 4 ug via INTRAVENOUS

## 2020-10-06 MED ORDER — KETOROLAC TROMETHAMINE 30 MG/ML IJ SOLN
INTRAMUSCULAR | Status: AC
  Filled 2020-10-06: qty 1

## 2020-10-06 MED ORDER — ONDANSETRON HCL 4 MG PO TABS: 4.0000 mg | ORAL_TABLET | Freq: Four times a day (QID) | ORAL | Status: DC | PRN

## 2020-10-06 MED ORDER — METHOCARBAMOL 1000 MG/10ML IJ SOLN
500.0000 mg | Freq: Four times a day (QID) | INTRAVENOUS | Status: DC | PRN
  Filled 2020-10-06: qty 5

## 2020-10-06 MED ORDER — SODIUM CHLORIDE 0.9 % IV SOLN: INTRAVENOUS | Status: DC

## 2020-10-06 MED ORDER — CEFAZOLIN SODIUM-DEXTROSE 2-4 GM/100ML-% IV SOLN
2.0000 g | Freq: Four times a day (QID) | INTRAVENOUS | Status: AC
Start: 2020-10-06 — End: 2020-10-06
  Administered 2020-10-06 (×2): 2 g via INTRAVENOUS
  Filled 2020-10-06 (×2): qty 100

## 2020-10-06 SURGICAL SUPPLY — 57 items
ADH SKN CLS APL DERMABOND .7 (GAUZE/BANDAGES/DRESSINGS) ×1
ATTUNE MED ANAT PAT 35 KNEE (Knees) ×2 IMPLANT
ATTUNE PS FEM RT SZ 4 CEM KNEE (Femur) ×1 IMPLANT
ATTUNE PSRP INSR SZ4 7 KNEE (Insert) ×1 IMPLANT
BAG SPEC THK2 15X12 ZIP CLS (MISCELLANEOUS)
BAG ZIPLOCK 12X15 (MISCELLANEOUS) IMPLANT
BASE TIBIAL ROT PLAT SZ 5 KNEE (Knees) ×1 IMPLANT
BLADE SAW SGTL 11.0X1.19X90.0M (BLADE) IMPLANT
BLADE SAW SGTL 13.0X1.19X90.0M (BLADE) ×2 IMPLANT
BLADE SURG SZ10 CARB STEEL (BLADE) ×4 IMPLANT
BNDG ELASTIC 6X5.8 VLCR STR LF (GAUZE/BANDAGES/DRESSINGS) ×2 IMPLANT
BOWL SMART MIX CTS (DISPOSABLE) ×2 IMPLANT
BSPLAT TIB 5 CMNT ROT PLAT STR (Knees) ×1 IMPLANT
CEMENT HV SMART SET (Cement) ×2 IMPLANT
COVER WAND RF STERILE (DRAPES) IMPLANT
CUFF TOURN SGL QUICK 34 (TOURNIQUET CUFF) ×2
CUFF TRNQT CYL 34X4.125X (TOURNIQUET CUFF) ×1 IMPLANT
DECANTER SPIKE VIAL GLASS SM (MISCELLANEOUS) ×4 IMPLANT
DERMABOND ADVANCED (GAUZE/BANDAGES/DRESSINGS) ×1
DERMABOND ADVANCED .7 DNX12 (GAUZE/BANDAGES/DRESSINGS) ×1 IMPLANT
DRAPE U-SHAPE 47X51 STRL (DRAPES) ×2 IMPLANT
DRESSING AQUACEL AG SP 3.5X10 (GAUZE/BANDAGES/DRESSINGS) ×1 IMPLANT
DRSG AQUACEL AG ADV 3.5X10 (GAUZE/BANDAGES/DRESSINGS) ×1 IMPLANT
DRSG AQUACEL AG SP 3.5X10 (GAUZE/BANDAGES/DRESSINGS) ×2
DURAPREP 26ML APPLICATOR (WOUND CARE) ×4 IMPLANT
ELECT PENCIL ROCKER SW 15FT (MISCELLANEOUS) ×2 IMPLANT
ELECT REM PT RETURN 15FT ADLT (MISCELLANEOUS) ×2 IMPLANT
GLOVE ORTHO TXT STRL SZ7.5 (GLOVE) ×2 IMPLANT
GLOVE SURG ENC MOIS LTX SZ6 (GLOVE) IMPLANT
GLOVE SURG UNDER POLY LF SZ6.5 (GLOVE) IMPLANT
GLOVE SURG UNDER POLY LF SZ7.5 (GLOVE) ×2 IMPLANT
GOWN STRL REUS W/TWL LRG LVL3 (GOWN DISPOSABLE) ×2 IMPLANT
HANDPIECE INTERPULSE COAX TIP (DISPOSABLE) ×2
HOLDER FOLEY CATH W/STRAP (MISCELLANEOUS) IMPLANT
KIT TURNOVER KIT A (KITS) ×2 IMPLANT
MANIFOLD NEPTUNE II (INSTRUMENTS) ×2 IMPLANT
NDL SAFETY ECLIPSE 18X1.5 (NEEDLE) IMPLANT
NEEDLE HYPO 18GX1.5 SHARP (NEEDLE)
NS IRRIG 1000ML POUR BTL (IV SOLUTION) ×2 IMPLANT
PACK TOTAL KNEE CUSTOM (KITS) ×2 IMPLANT
PENCIL SMOKE EVACUATOR (MISCELLANEOUS) IMPLANT
PIN DRILL FIX HALF THREAD (BIT) ×1 IMPLANT
PIN FIX SIGMA LCS THRD HI (PIN) ×1 IMPLANT
PROTECTOR NERVE ULNAR (MISCELLANEOUS) ×2 IMPLANT
SET HNDPC FAN SPRY TIP SCT (DISPOSABLE) ×1 IMPLANT
SET PAD KNEE POSITIONER (MISCELLANEOUS) ×2 IMPLANT
SUT MNCRL AB 4-0 PS2 18 (SUTURE) ×2 IMPLANT
SUT STRATAFIX PDS+ 0 24IN (SUTURE) ×2 IMPLANT
SUT VIC AB 1 CT1 36 (SUTURE) ×2 IMPLANT
SUT VIC AB 2-0 CT1 27 (SUTURE) ×6
SUT VIC AB 2-0 CT1 TAPERPNT 27 (SUTURE) ×3 IMPLANT
SYR 3ML LL SCALE MARK (SYRINGE) ×2 IMPLANT
TIBIAL BASE ROT PLAT SZ 5 KNEE (Knees) ×2 IMPLANT
TRAY FOLEY MTR SLVR 16FR STAT (SET/KITS/TRAYS/PACK) ×2 IMPLANT
TUBE SUCTION HIGH CAP CLEAR NV (SUCTIONS) ×2 IMPLANT
WATER STERILE IRR 1000ML POUR (IV SOLUTION) ×4 IMPLANT
WRAP KNEE MAXI GEL POST OP (GAUZE/BANDAGES/DRESSINGS) ×2 IMPLANT

## 2020-10-06 NOTE — Transfer of Care (Signed)
Immediate Anesthesia Transfer of Care Note  Patient: [name]  Procedure(s) Performed: TOTAL KNEE ARTHROPLASTY (Right Knee)  Patient Location: PACU  Anesthesia Type:Spinal and MAC combined with regional for post-op pain  Level of Consciousness: awake, drowsy and patient cooperative  Airway & Oxygen Therapy: Patient Spontanous Breathing and Patient connected to face mask oxygen  Post-op Assessment: Report given to RN and Post -op Vital signs reviewed and stable  Post vital signs: Reviewed and stable  Last Vitals:  Vitals Value Taken Time  BP 111/76 10/06/20 1155  Temp 36.4 C 10/06/20 1155  Pulse 90 10/06/20 1155  Resp 26 10/06/20 1155  SpO2 100 % 10/06/20 1155  Vitals shown include unvalidated device data.  Last Pain:  Vitals:   10/06/20 0848  TempSrc:   PainSc: 0-No pain      Patients Stated Pain Goal: 5 (48/25/00 3704)  Complications: No complications documented.

## 2020-10-06 NOTE — Op Note (Signed)
NAME:  [name]                      MEDICAL RECORD NO.:  [id number]                             FACILITY:  [facility]      PHYSICIAN:  Pietro Cassis. Alvan Dame, M.D.  DATE OF BIRTH:  [birth year]      DATE OF PROCEDURE:  10/06/2020                                     OPERATIVE REPORT         PREOPERATIVE DIAGNOSIS:  Right knee osteoarthritis.      POSTOPERATIVE DIAGNOSIS:  Right knee osteoarthritis.      FINDINGS:  The patient was noted to have complete loss of cartilage and   bone-on-bone arthritis with associated osteophytes in the medial and patellofemoral compartments of   the knee with a significant synovitis and associated effusion.  The patient had failed months of conservative treatment including medications, injection therapy, activity modification.     PROCEDURE:  Right total knee replacement.      COMPONENTS USED:  DePuy Attune rotating platform posterior stabilized knee   system, a size 4 femur, 5 tibia, size 7 mm PS AOX insert, and 35 anatomic patellar   button.      SURGEON:  Pietro Cassis. Alvan Dame, M.D.      ASSISTANT:  Griffith Citron, PA-C.      ANESTHESIA:  Regional and Spinal.      SPECIMENS:  None.      COMPLICATION:  None.      DRAINS:  None.  EBL: <100 cc      TOURNIQUET TIME:   Total Tourniquet Time Documented: Thigh (Right) - 32 minutes Total: Thigh (Right) - 32 minutes  .      The patient was stable to the recovery room.      INDICATION FOR PROCEDURE:  [name] is a 55 y.o. female patient of   mine.  The patient had been seen, evaluated, and treated for months conservatively in the   office with medication, activity modification, and injections.  The patient had   radiographic changes of bone-on-bone arthritis with endplate sclerosis and osteophytes noted.  Based on the radiographic changes and failed conservative measures, the patient   decided to proceed with definitive treatment, total knee replacement.  Risks of infection, DVT,  component failure, need for revision surgery, neurovascular injury were reviewed in the office setting.  The postop course was reviewed stressing the efforts to maximize post-operative satisfaction and function.  Consent was obtained for benefit of pain   relief.      PROCEDURE IN DETAIL:  The patient was brought to the operative theater.   Once adequate anesthesia, preoperative antibiotics, 2 gm of Ancef,1 gm of Tranexamic Acid, and 10 mg of Decadron administered, the patient was positioned supine with a right thigh tourniquet placed.  The  right lower extremity was prepped and draped in sterile fashion.  A time-   out was performed identifying the patient, planned procedure, and the appropriate extremity.      The right lower extremity was placed in the [facility] leg holder.  The leg was   exsanguinated, tourniquet elevated to 250 mmHg.  A midline incision was  made followed by median parapatellar arthrotomy.  Following initial   exposure, attention was first directed to the patella.  Precut   measurement was noted to be 24 mm.  I resected down to 14 mm and used a   35 anatomic patellar button to restore patellar height as well as cover the cut surface.      The lug holes were drilled and a metal shim was placed to protect the   patella from retractors and saw blade during the procedure.      At this point, attention was now directed to the femur.  The femoral   canal was opened with a drill, irrigated to try to prevent fat emboli.  An   intramedullary rod was passed at 3 degrees valgus, 9 mm of bone was   resected off the distal femur.  Following this resection, the tibia was   subluxated anteriorly.  Using the extramedullary guide, 2 mm of bone was resected off   the proximal medial tibia.  We confirmed the gap would be   stable medially and laterally with a size 5 spacer block as well as confirmed that the tibial cut was perpendicular in the coronal plane, checking with an alignment rod.       Once this was done, I sized the femur to be a size 4 in the anterior-   posterior dimension, chose a standard component based on medial and   lateral dimension.  The size 4 rotation block was then pinned in   position anterior referenced using the C-clamp to set rotation.  The   anterior, posterior, and  chamfer cuts were made without difficulty nor   notching making certain that I was along the anterior cortex to help   with flexion gap stability.      The final box cut was made off the lateral aspect of distal femur.      At this point, the tibia was sized to be a size 5.  The size 5 tray was   then pinned in position through the medial third of the tubercle,   drilled, and keel punched.  Trial reduction was now carried with a 4 femur,  5 tibia, a size 7 mm PS insert, and the 35 anatomic patella botton.  The knee was brought to full extension with good flexion stability with the patella   tracking through the trochlea without application of pressure.  Given   all these findings the trial components removed.  Final components were   opened and cement was mixed.  The knee was irrigated with normal saline solution and pulse lavage.  The synovial lining was   then injected with 30 cc of 0.25% Marcaine with epinephrine, 1 cc of Toradol and 30 cc of NS for a total of 61 cc.     Final implants were then cemented onto cleaned and dried cut surfaces of bone with the knee brought to extension with a size 7 mm PS trial insert.      Once the cement had fully cured, excess cement was removed   throughout the knee.  I confirmed that I was satisfied with the range of   motion and stability, and the final size 7 mm PS AOX insert was chosen.  It was   placed into the knee.      The tourniquet had been let down at 32 minutes.  No significant   hemostasis was required.  The extensor mechanism was then reapproximated using #1 Vicryl  and #1 Stratafix sutures with the knee   in flexion.  The    remaining wound was closed with 2-0 Vicryl and running 4-0 Monocryl.   The knee was cleaned, dried, dressed sterilely using Dermabond and   Aquacel dressing.  The patient was then   brought to recovery room in stable condition, tolerating the procedure   well.   Please note that Physician Assistant, Griffith Citron, PA-C was present for the entirety of the case, and was utilized for pre-operative positioning, peri-operative retractor management, general facilitation of the procedure and for primary wound closure at the end of the case.              Pietro Cassis Alvan Dame, M.D.    10/06/2020 11:40 AM

## 2020-10-06 NOTE — Interval H&P Note (Signed)
History and Physical Interval Note:  10/06/2020 8:32 AM  [name]  has presented today for surgery, with the diagnosis of Right knee osteoarthritis.  The various methods of treatment have been discussed with the patient and family. After consideration of risks, benefits and other options for treatment, the patient has consented to  Procedure(s): TOTAL KNEE ARTHROPLASTY (Right) as a surgical intervention.  The patient's history has been reviewed, patient examined, no change in status, stable for surgery.  I have reviewed the patient's chart and labs.  Questions were answered to the patient's satisfaction.     Mauri Pole

## 2020-10-06 NOTE — Plan of Care (Signed)
  Problem: Education: Goal: Knowledge of General Education information will improve Description: Including pain rating scale, medication(s)/side effects and non-pharmacologic comfort measures Outcome: Progressing   Problem: Activity: Goal: Risk for activity intolerance will decrease Outcome: Progressing   Problem: Pain Managment: Goal: General experience of comfort will improve Outcome: Progressing   

## 2020-10-06 NOTE — Anesthesia Preprocedure Evaluation (Addendum)
Anesthesia Evaluation  Patient identified by MRN, date of birth, ID band Patient awake    Reviewed: Allergy & Precautions, H&P , NPO status , Patient's Chart, lab work & pertinent test results  Airway Mallampati: I  TM Distance: >3 FB Neck ROM: full    Dental  (+) Edentulous Upper, Dental Advisory Given, Poor Dentition,    Pulmonary neg pulmonary ROS, former smoker,    Pulmonary exam normal breath sounds clear to auscultation       Cardiovascular Exercise Tolerance: Good Normal cardiovascular exam Rhythm:regular Rate:Normal     Neuro/Psych  Headaches, negative psych ROS   GI/Hepatic   Endo/Other  Hypothyroidism Morbid obesity  Renal/GU   negative genitourinary   Musculoskeletal  (+) Arthritis , Osteoarthritis,    Abdominal (+) + obese,   Peds  Hematology  (+) Blood dyscrasia, anemia ,   Anesthesia Other Findings   Reproductive/Obstetrics negative OB ROS                            Anesthesia Physical  Anesthesia Plan  ASA: II  Anesthesia Plan: MAC and Spinal   Post-op Pain Management:  Regional for Post-op pain   Induction: Intravenous  PONV Risk Score and Plan: 2  Airway Management Planned: Nasal Cannula, Natural Airway and Simple Face Mask  Additional Equipment: None  Intra-op Plan:   Post-operative Plan:   Informed Consent: I have reviewed the patients History and Physical, chart, labs and discussed the procedure including the risks, benefits and alternatives for the proposed anesthesia with the patient or authorized representative who has indicated his/her understanding and acceptance.     Dental Advisory Given  Plan Discussed with: CRNA and Surgeon  Anesthesia Plan Comments:        Anesthesia Quick Evaluation

## 2020-10-06 NOTE — Anesthesia Procedure Notes (Signed)
Anesthesia Regional Block: Adductor canal block   Pre-Anesthetic Checklist: ,, timeout performed, Correct Patient, Correct Site, Correct Laterality, Correct Procedure, Correct Position, site marked, Risks and benefits discussed,  Surgical consent,  Pre-op evaluation,  At surgeon's request and post-op pain management  Laterality: Right  Prep: chloraprep       Needles:  Injection technique: Single-shot  Needle Type: Echogenic Stimulator Needle     Needle Length: 5cm  Needle Gauge: 22     Additional Needles:   Procedures:, nerve stimulator,,, ultrasound used (permanent image in chart),,,,  Narrative:  Start time: 10/06/2020 8:53 AM End time: 10/06/2020 9:00 AM Injection made incrementally with aspirations every 5 mL.  Performed by: Personally   Additional Notes: Functioning IV was confirmed and monitors were applied.  A 27mm 22ga Arrow echogenic stimulator needle was used. Sterile prep and drape,hand hygiene and sterile gloves were used. Ultrasound guidance: relevant anatomy identified, needle position confirmed, local anesthetic spread visualized around nerve(s)., vascular puncture avoided.  Image printed for medical record. Negative aspiration and negative test dose prior to incremental administration of local anesthetic. The patient tolerated the procedure well.

## 2020-10-06 NOTE — Discharge Instructions (Signed)

## 2020-10-06 NOTE — Progress Notes (Signed)
AssistedDr. Oddono with right, ultrasound guided, adductor canal block. Side rails up, monitors on throughout procedure. See vital signs in flow sheet. Tolerated Procedure well.  

## 2020-10-06 NOTE — Anesthesia Procedure Notes (Signed)
Spinal  Patient location during procedure: OR Start time: 10/06/2020 10:10 AM End time: 10/06/2020 10:15 AM Reason for block: surgical anesthesia Staffing Anesthesiologist: Janeece Riggers, MD Preanesthetic Checklist Completed: patient identified, IV checked, site marked, risks and benefits discussed, surgical consent, monitors and equipment checked, pre-op evaluation and timeout performed Spinal Block Patient position: sitting Prep: DuraPrep Patient monitoring: heart rate, cardiac monitor, continuous pulse ox and blood pressure Approach: midline Location: L3-4 Injection technique: single-shot Needle Needle type: Sprotte and Whitacre  Needle gauge: 22 G Needle length: 9 cm Assessment Sensory level: T4 Events: CSF return Additional Notes Change space and needle.

## 2020-10-06 NOTE — Anesthesia Postprocedure Evaluation (Signed)
Anesthesia Post Note  Patient: [name]  Procedure(s) Performed: TOTAL KNEE ARTHROPLASTY (Right Knee)     Patient location during evaluation: PACU Anesthesia Type: MAC and Spinal Level of consciousness: awake and alert Pain management: pain level controlled Vital Signs Assessment: post-procedure vital signs reviewed and stable Respiratory status: spontaneous breathing, nonlabored ventilation, respiratory function stable and patient connected to nasal cannula oxygen Cardiovascular status: stable and blood pressure returned to baseline Postop Assessment: no apparent nausea or vomiting Anesthetic complications: no   No complications documented.  Last Vitals:  Vitals:   10/06/20 1213 10/06/20 1215  BP:  118/78  Pulse:  71  Resp:  19  Temp:    SpO2: 100% 98%    Last Pain:  Vitals:   10/06/20 1213  TempSrc:   PainSc: 0-No pain                 Zeth Buday

## 2020-10-07 ENCOUNTER — Other Ambulatory Visit (HOSPITAL_COMMUNITY): Payer: Self-pay

## 2020-10-07 ENCOUNTER — Encounter (HOSPITAL_COMMUNITY): Payer: Self-pay | Admitting: Orthopedic Surgery

## 2020-10-07 DIAGNOSIS — M1711 Unilateral primary osteoarthritis, right knee: Secondary | ICD-10-CM | POA: Diagnosis not present

## 2020-10-07 DIAGNOSIS — E039 Hypothyroidism, unspecified: Secondary | ICD-10-CM | POA: Diagnosis not present

## 2020-10-07 DIAGNOSIS — Z96651 Presence of right artificial knee joint: Secondary | ICD-10-CM | POA: Diagnosis not present

## 2020-10-07 DIAGNOSIS — Z87891 Personal history of nicotine dependence: Secondary | ICD-10-CM | POA: Diagnosis not present

## 2020-10-07 DIAGNOSIS — Z96642 Presence of left artificial hip joint: Secondary | ICD-10-CM | POA: Diagnosis not present

## 2020-10-07 DIAGNOSIS — Z7982 Long term (current) use of aspirin: Secondary | ICD-10-CM | POA: Diagnosis not present

## 2020-10-07 DIAGNOSIS — Z79899 Other long term (current) drug therapy: Secondary | ICD-10-CM | POA: Diagnosis not present

## 2020-10-07 LAB — CBC
HCT: 33.7 % — ABNORMAL LOW (ref 36.0–46.0)
Hemoglobin: 11 g/dL — ABNORMAL LOW (ref 12.0–15.0)
MCH: 29.6 pg (ref 26.0–34.0)
MCHC: 32.6 g/dL (ref 30.0–36.0)
MCV: 90.6 fL (ref 80.0–100.0)
Platelets: 322 10*3/uL (ref 150–400)
RBC: 3.72 MIL/uL — ABNORMAL LOW (ref 3.87–5.11)
RDW: 12.3 % (ref 11.5–15.5)
WBC: 13.7 10*3/uL — ABNORMAL HIGH (ref 4.0–10.5)
nRBC: 0 % (ref 0.0–0.2)

## 2020-10-07 LAB — BASIC METABOLIC PANEL
Anion gap: 10 (ref 5–15)
BUN: 11 mg/dL (ref 6–20)
CO2: 21 mmol/L — ABNORMAL LOW (ref 22–32)
Calcium: 8.6 mg/dL — ABNORMAL LOW (ref 8.9–10.3)
Chloride: 107 mmol/L (ref 98–111)
Creatinine, Ser: 0.91 mg/dL (ref 0.44–1.00)
GFR, Estimated: >60 mL/min (ref >60)
Glucose, Bld: 163 mg/dL — ABNORMAL HIGH (ref 70–99)
Potassium: 3.8 mmol/L (ref 3.5–5.1)
Sodium: 138 mmol/L (ref 135–145)

## 2020-10-07 MED ORDER — HYDROCODONE-ACETAMINOPHEN 5-325 MG PO TABS
1.0000 | ORAL_TABLET | Freq: Four times a day (QID) | ORAL | 0 refills | Status: DC | PRN
  Filled 2020-10-07: qty 42, 6d supply, fill #0

## 2020-10-07 MED ORDER — CELECOXIB 200 MG PO CAPS
200.0000 mg | ORAL_CAPSULE | Freq: Two times a day (BID) | ORAL | 0 refills | Status: DC
  Filled 2020-10-07: qty 60, 30d supply, fill #0

## 2020-10-07 MED ORDER — POLYETHYLENE GLYCOL 3350 17 G PO PACK
17.0000 g | PACK | Freq: Every day | ORAL | 0 refills | Status: DC | PRN
  Filled 2020-10-07: qty 14, 14d supply, fill #0

## 2020-10-07 MED ORDER — METHOCARBAMOL 500 MG PO TABS
500.0000 mg | ORAL_TABLET | Freq: Four times a day (QID) | ORAL | 0 refills | Status: DC | PRN
  Filled 2020-10-07: qty 40, 10d supply, fill #0

## 2020-10-07 MED ORDER — ASPIRIN 81 MG PO CHEW
81.0000 mg | CHEWABLE_TABLET | Freq: Two times a day (BID) | ORAL | 0 refills | Status: AC
  Filled 2020-10-07: qty 56, 28d supply, fill #0

## 2020-10-07 MED ORDER — DOCUSATE SODIUM 100 MG PO CAPS
100.0000 mg | ORAL_CAPSULE | Freq: Two times a day (BID) | ORAL | 0 refills | Status: DC
  Filled 2020-10-07: qty 10, 5d supply, fill #0

## 2020-10-07 NOTE — Progress Notes (Signed)
   Subjective: 1 Day Post-Op Procedure(s) (LRB): TOTAL KNEE ARTHROPLASTY (Right) Patient reports pain as mild.   Patient seen in rounds for Dr. Alvan Dame. Patient is well, and has had no acute complaints or problems. No acute events overnight. Foley catheter removed.  We will start therapy today.   Objective: Vital signs in last 24 hours: Temp:  [97.2 F (36.2 C)-97.8 F (36.6 C)] 97.8 F (36.6 C) (04/13 0630) Pulse Rate:  [55-90] 60 (04/13 0630) Resp:  [14-26] 16 (04/13 0630) BP: (111-130)/(75-90) 124/83 (04/13 0630) SpO2:  [98 %-100 %] 99 % (04/13 0630)  Intake/Output from previous day:  Intake/Output Summary (Last 24 hours) at 10/07/2020 0900 Last data filed at 10/07/2020 0643 Gross per 24 hour  Intake 3525.01 ml  Output 2950 ml  Net 575.01 ml     Intake/Output this shift: No intake/output data recorded.  Labs: Recent Labs    10/07/20 0323  HGB 11.0*   Recent Labs    10/07/20 0323  WBC 13.7*  RBC 3.72*  HCT 33.7*  PLT 322   Recent Labs    10/07/20 0323  NA 138  K 3.8  CL 107  CO2 21*  BUN 11  CREATININE 0.91  GLUCOSE 163*  CALCIUM 8.6*   No results for input(s): LABPT, INR in the last 72 hours.  Exam: General - Patient is Alert and Oriented Extremity - Neurologically intact Sensation intact distally Intact pulses distally Dorsiflexion/Plantar flexion intact Dressing - dressing C/D/I Motor Function - intact, moving foot and toes well on exam.   Past Medical History:  Diagnosis Date  . Allergy   . Anemia 2006   "younger days"  . Arthritis   . Headache(784.0) 6 years ago  . History of blood transfusion sept 2014   1 unit  . Hypothyroidism   . Thyroid disease    Hypothyroid on replacement therapy    Assessment/Plan: 1 Day Post-Op Procedure(s) (LRB): TOTAL KNEE ARTHROPLASTY (Right) Active Problems:   S/P total knee arthroplasty, right  Estimated body mass index is 34.54 kg/m as calculated from the following:   Height as of this  encounter: 5\' 6"  (1.676 m).   Weight as of this encounter: 97.1 kg. Advance diet Up with therapy D/C IV fluids   Patient's anticipated LOS is less than 2 midnights, meeting these requirements: - Younger than 21 - Lives within 1 hour of care - Has a competent adult at home to recover with post-op recover - NO history of  - Chronic pain requiring opiods  - Diabetes  - Coronary Artery Disease  - Heart failure  - Heart attack  - Stroke  - DVT/VTE  - Cardiac arrhythmia  - Respiratory Failure/COPD  - Renal failure  - Anemia  - Advanced Liver disease   DVT Prophylaxis - Aspirin Weight bearing as tolerated.  Hemoglobin stable at 11.0 today.  Plan is to go Home after hospital stay. Plan for discharge today following 1-2 sessions of PT as long as they are meeting their goals. Patient is scheduled for OPPT. Follow up in the office in 2 weeks.   Griffith Citron, PA-C Orthopedic Surgery [phone] 10/07/2020, 9:00 AM

## 2020-10-07 NOTE — TOC Transition Note (Signed)
Transition of Care [facility]) - CM/SW Discharge Note   Patient Details  Name: [name] MRN: [id number] Date of Birth: 09/23/1965  [facility] CM/SW Contact:  Lennart Pall, LCSW Phone Number: 10/07/2020, 2:04 PM   Clinical Narrative:    Met briefly with pt and confirmed receipt of rw and 3n1 via [place].  Plan for OPPT at [facility].  No further TOC needs.   Final next level of care: OP Rehab Barriers to Discharge: No Barriers Identified   Patient Goals and CMS Choice Patient states their goals for this hospitalization and ongoing recovery are:: return home      Discharge Placement                       Discharge Plan and Services                DME Arranged: 3-N-1,Walker rolling DME Agency: [employer] Date DME Agency Contacted:  (ordered prior to surgery)                Social Determinants of Health (SDOH) Interventions     Readmission Risk Interventions No flowsheet data found.

## 2020-10-07 NOTE — Progress Notes (Signed)
Patient has DME walker and 3n1 commode

## 2020-10-07 NOTE — Progress Notes (Signed)
Physical Therapy Treatment Patient Details Name: [name] MRN: [id number] DOB: [birth year] Today's Date: 10/07/2020    History of Present Illness s/p R TKA. PMH: L THA and revision    PT Comments    Pt progressing well. See below for mobility. Pt is ready for d/c wthi family assist from PT standpoint    Follow Up Recommendations  Follow surgeon's recommendation for DC plan and follow-up therapies     Equipment Recommendations  None recommended by PT    Recommendations for Other Services       Precautions / Restrictions Precautions Precautions: Fall;Knee Restrictions Weight Bearing Restrictions: No Other Position/Activity Restrictions: WBAT    Mobility  Bed Mobility Overal bed mobility: Needs Assistance Bed Mobility: Supine to Sit     Supine to sit: Min guard     General bed mobility comments: OOB in recliner    Transfers Overall transfer level: Needs assistance Equipment used: Rolling walker (2 wheeled) Transfers: Sit to/from Stand Sit to Stand: Supervision         General transfer comment: cues for hand placement and LLE position  Ambulation/Gait Ambulation/Gait assistance: Supervision;Modified independent (Device/Increase time) Gait Distance (Feet): 130 Feet Assistive device: Rolling walker (2 wheeled) Gait Pattern/deviations: Step-to pattern;Step-through pattern;Decreased stance time - left     General Gait Details: good gait stabilitywith RW, improved fluidity, incr stance time on RLE   Stairs Stairs: Yes Stairs assistance: Supervision;Min guard Stair Management: Step to pattern;Forwards;With walker Number of Stairs: 1 General stair comments: cues for sequence and safe technique   Wheelchair Mobility    Modified Rankin (Stroke Patients Only)       Balance                                            Cognition Arousal/Alertness: Awake/alert Behavior During Therapy: WFL for tasks  assessed/performed Overall Cognitive Status: Within Functional Limits for tasks assessed                                        Exercises Total Joint Exercises Ankle Circles/Pumps: AROM;Both;10 reps Quad Sets: AROM;Both;5 reps Heel Slides: AAROM;Right;10 reps Straight Leg Raises: AAROM;Right;5 reps    General Comments        Pertinent Vitals/Pain Pain Assessment: No/denies pain Pain Score: 3  Pain Location: left knee Pain Descriptors / Indicators: Aching;Sore Pain Intervention(s): Premedicated before session;Monitored during session;Limited activity within patient's tolerance    Home Living Family/patient expects to be discharged to:: Private residence Living Arrangements: Alone Available Help at Discharge: Family Type of Home: House Home Access: Stairs to enter   Home Layout: One level Home Equipment: None      Prior Function Level of Independence: Independent          PT Goals (current goals can now be found in the care plan section) Acute Rehab PT Goals Patient Stated Goal: home today PT Goal Formulation: With patient Time For Goal Achievement: 10/14/20 Potential to Achieve Goals: Good Progress towards PT goals: Progressing toward goals    Frequency    7X/week      PT Plan Current plan remains appropriate    Co-evaluation              AM-PAC PT "6 Clicks" Mobility   Outcome Measure  Help needed turning  from your back to your side while in a flat bed without using bedrails?: A Little Help needed moving from lying on your back to sitting on the side of a flat bed without using bedrails?: A Little Help needed moving to and from a bed to a chair (including a wheelchair)?: None Help needed standing up from a chair using your arms (e.g., wheelchair or bedside chair)?: None Help needed to walk in hospital room?: A Little Help needed climbing 3-5 steps with a railing? : A Little 6 Click Score: 20    End of Session Equipment  Utilized During Treatment: Gait belt Activity Tolerance: Patient tolerated treatment well Patient left: in chair;with call bell/phone within reach;with chair alarm set Nurse Communication: Mobility status PT Visit Diagnosis: Difficulty in walking, not elsewhere classified (R26.2)     Time: 4975-3005 PT Time Calculation (min) (ACUTE ONLY): 17 min  Charges:  $Gait Training: 8-22 mins                     Baxter Flattery, PT  Acute Rehab Dept ([place]) [phone] Pager [phone]  10/07/2020    [facility] 10/07/2020, 2:40 PM

## 2020-10-07 NOTE — Evaluation (Signed)
Physical Therapy Evaluation Patient Details Name: [name] MRN: [id number] DOB: Nov 05, 1965 Today's Date: 10/07/2020   History of Present Illness  s/p R TKA. PMH: L THA and revision  Clinical Impression  Pt is s/p TKA resulting in the deficits listed below (see PT Problem List).  Pt doing well, amb ~ 180' with RW. Will see for second session this pm and pt should be ready to d/c at that time  Pt will benefit from skilled PT to increase their independence and safety with mobility to allow discharge to the venue listed below.      Follow Up Recommendations Follow surgeon's recommendation for DC plan and follow-up therapies    Equipment Recommendations  None recommended by PT    Recommendations for Other Services       Precautions / Restrictions Precautions Precautions: Fall Restrictions Weight Bearing Restrictions: No      Mobility  Bed Mobility Overal bed mobility: Needs Assistance Bed Mobility: Supine to Sit     Supine to sit: Min guard     General bed mobility comments: for safety    Transfers Overall transfer level: Needs assistance Equipment used: Rolling walker (2 wheeled) Transfers: Sit to/from Stand Sit to Stand: Min guard         General transfer comment: cues for hand placement and LLE position  Ambulation/Gait Ambulation/Gait assistance: Min guard Gait Distance (Feet): 180 Feet Assistive device: Rolling walker (2 wheeled) Gait Pattern/deviations: Step-to pattern;Step-through pattern;Decreased stance time - left     General Gait Details: cues for sequence, gait progression  Stairs            Wheelchair Mobility    Modified Rankin (Stroke Patients Only)       Balance                                             Pertinent Vitals/Pain Pain Assessment: 0-10 Pain Score: 3  Pain Location: left knee Pain Descriptors / Indicators: Aching;Sore Pain Intervention(s): Premedicated before session;Monitored  during session;Limited activity within patient's tolerance    Home Living Family/patient expects to be discharged to:: Private residence Living Arrangements: Alone Available Help at Discharge: Family Type of Home: House Home Access: Stairs to enter   Technical brewer of Steps: 1 Home Layout: One level Home Equipment: None      Prior Function Level of Independence: Independent               Hand Dominance        Extremity/Trunk Assessment   Upper Extremity Assessment Upper Extremity Assessment: Defer to OT evaluation    Lower Extremity Assessment Lower Extremity Assessment: LLE deficits/detail LLE Deficits / Details: ankle WFL, knee and hip grossly 3/5, AROM~ 10 to 55 degrees flexion       Communication   Communication: No difficulties  Cognition Arousal/Alertness: Awake/alert Behavior During Therapy: WFL for tasks assessed/performed Overall Cognitive Status: Within Functional Limits for tasks assessed                                        General Comments      Exercises Total Joint Exercises Ankle Circles/Pumps: AROM;Both;10 reps Quad Sets: AROM;Both;5 reps   Assessment/Plan    PT Assessment Patient needs continued PT services  PT Problem List Decreased strength;Decreased  mobility;Decreased range of motion;Decreased activity tolerance;Pain;Decreased knowledge of use of DME       PT Treatment Interventions DME instruction;Therapeutic activities;Gait training;Functional mobility training;Therapeutic exercise;Patient/family education;Stair training    PT Goals (Current goals can be found in the Care Plan section)  Acute Rehab PT Goals Patient Stated Goal: home today PT Goal Formulation: With patient Time For Goal Achievement: 10/14/20 Potential to Achieve Goals: Good    Frequency 7X/week   Barriers to discharge        Co-evaluation               AM-PAC PT "6 Clicks" Mobility  Outcome Measure Help needed turning  from your back to your side while in a flat bed without using bedrails?: A Little Help needed moving from lying on your back to sitting on the side of a flat bed without using bedrails?: A Little Help needed moving to and from a bed to a chair (including a wheelchair)?: A Little Help needed standing up from a chair using your arms (e.g., wheelchair or bedside chair)?: A Little Help needed to walk in hospital room?: A Little Help needed climbing 3-5 steps with a railing? : A Little 6 Click Score: 18    End of Session Equipment Utilized During Treatment: Gait belt Activity Tolerance: Patient tolerated treatment well Patient left: in chair;with call bell/phone within reach;with chair alarm set Nurse Communication: Mobility status PT Visit Diagnosis: Difficulty in walking, not elsewhere classified (R26.2)    Time: 2831-5176 PT Time Calculation (min) (ACUTE ONLY): 25 min   Charges:   PT Evaluation $PT Eval Low Complexity: 1 Low PT Treatments $Gait Training: 8-22 mins        Baxter Flattery, PT  Acute Rehab Dept ([place]) [phone] Pager [phone]  10/07/2020   [facility] 10/07/2020, 12:03 PM

## 2020-10-07 NOTE — Plan of Care (Signed)
  Problem: Education: Goal: Knowledge of General Education information will improve Description Including pain rating scale, medication(s)/side effects and non-pharmacologic comfort measures Outcome: Progressing   Problem: Clinical Measurements: Goal: Ability to maintain clinical measurements within normal limits will improve Outcome: Progressing   Problem: Activity: Goal: Risk for activity intolerance will decrease Outcome: Progressing   

## 2020-10-07 NOTE — Plan of Care (Signed)
Plan of care reviewed and discussed with the patient. 

## 2020-10-07 NOTE — Plan of Care (Signed)
Patient discharged all careplans met  

## 2020-10-09 DIAGNOSIS — M25561 Pain in right knee: Secondary | ICD-10-CM | POA: Diagnosis not present

## 2020-10-12 NOTE — Discharge Summary (Signed)
Physician Discharge Summary   Patient ID: [name] MRN: [id number] DOB/AGE: [birth year] 55 y.o.  Admit date: 10/06/2020 Discharge date: 10/07/2020  Primary Diagnosis: Right knee osteoarthritis  Admission Diagnoses:  Past Medical History:  Diagnosis Date  . Allergy   . Anemia 2006   "younger days"  . Arthritis   . Headache(784.0) 6 years ago  . History of blood transfusion sept 2014   1 unit  . Hypothyroidism   . Thyroid disease    Hypothyroid on replacement therapy   Discharge Diagnoses:   Active Problems:   S/P total knee arthroplasty, right  Estimated body mass index is 34.54 kg/m as calculated from the following:   Height as of this encounter: 5\' 6"  (1.676 m).   Weight as of this encounter: 97.1 kg.  Procedure:  Procedure(s) (LRB): TOTAL KNEE ARTHROPLASTY (Right)   Consults: None  HPI:  [name] is a 55 y.o. female patient of   mine.  The patient had been seen, evaluated, and treated for months conservatively in the office with medication, activity modification, and injections.  The patient had   radiographic changes of bone-on-bone arthritis with endplate sclerosis and osteophytes noted.  Based on the radiographic changes and failed conservative measures, the patient   decided to proceed with definitive treatment, total knee replacement.  Risks of infection, DVT, component failure, need for revision surgery, neurovascular injury were reviewed in the office setting.  The postop course was reviewed stressing the efforts to maximize post-operative satisfaction and function.  Consent was obtained for benefit of pain   relief.   Laboratory Data: Admission on 10/06/2020, Discharged on 10/07/2020  Component Date Value Ref Range Status  . Preg Test, Ur 10/06/2020 NEGATIVE  NEGATIVE Final   Comment:        THE SENSITIVITY OF THIS METHODOLOGY IS >20 mIU/mL. Performed at [facility], [place] [address] [place], [place]  [address]   . WBC 10/07/2020 13.7* 4.0 - 10.5 K/uL Final  . RBC 10/07/2020 3.72* 3.87 - 5.11 MIL/uL Final  . Hemoglobin 10/07/2020 11.0* 12.0 - 15.0 g/dL Final  . HCT 10/07/2020 33.7* 36.0 - 46.0 % Final  . MCV 10/07/2020 90.6  80.0 - 100.0 fL Final  . MCH 10/07/2020 29.6  26.0 - 34.0 pg Final  . MCHC 10/07/2020 32.6  30.0 - 36.0 g/dL Final  . RDW 10/07/2020 12.3  11.5 - 15.5 % Final  . Platelets 10/07/2020 322  150 - 400 K/uL Final  . nRBC 10/07/2020 0.0  0.0 - 0.2 % Final   Performed at [facility], [place] [address], [place], [place] [address]  . Sodium 10/07/2020 138  135 - 145 mmol/L Final  . Potassium 10/07/2020 3.8  3.5 - 5.1 mmol/L Final  . Chloride 10/07/2020 107  98 - 111 mmol/L Final  . CO2 10/07/2020 21* 22 - 32 mmol/L Final  . Glucose, Bld 10/07/2020 163* 70 - 99 mg/dL Final   Glucose reference range applies only to samples taken after fasting for at least 8 hours.  . BUN 10/07/2020 11  6 - 20 mg/dL Final  . Creatinine, Ser 10/07/2020 0.91  0.44 - 1.00 mg/dL Final  . Calcium 10/07/2020 8.6* 8.9 - 10.3 mg/dL Final  . GFR, Estimated 10/07/2020 >60  >60 mL/min Final   Comment: (NOTE) Calculated using the CKD-EPI Creatinine Equation (2021)   . Anion gap 10/07/2020 10  5 - 15 Final   Performed at [facility], [place]  [place], [place] [address]  Hospital Outpatient Visit on 10/02/2020  Component Date Value Ref Range Status  . SARS Coronavirus 2 10/02/2020 NEGATIVE  NEGATIVE Final   Comment: (NOTE) SARS-CoV-2 target nucleic acids are NOT DETECTED.  The SARS-CoV-2 RNA is generally detectable in upper and lower respiratory specimens during the acute phase of infection. Negative results do not preclude SARS-CoV-2 infection, do not rule out co-infections with other pathogens, and should not be used as the sole basis for treatment or other patient management decisions. Negative results must be combined with clinical  observations, patient history, and epidemiological information. The expected result is Negative.  Fact Sheet for Patients: SugarRoll.be  Fact Sheet for Healthcare Providers: https://www.woods-mathews.com/  This test is not yet approved or cleared by the [place] FDA and  has been authorized for detection and/or diagnosis of SARS-CoV-2 by FDA under an Emergency Use Authorization (EUA). This EUA will remain  in effect (meaning this test can be used) for the duration of the COVID-19 declaration under Se                          ction 564(b)(1) of the Act, 21 U.S.C. section 360bbb-3(b)(1), unless the authorization is terminated or revoked sooner.  Performed at [facility], [place] [address] [place], [place] [address]   Hospital Outpatient Visit on 09/29/2020  Component Date Value Ref Range Status  . WBC 09/29/2020 7.1  4.0 - 10.5 K/uL Final  . RBC 09/29/2020 4.41  3.87 - 5.11 MIL/uL Final  . Hemoglobin 09/29/2020 13.1  12.0 - 15.0 g/dL Final  . HCT 09/29/2020 40.8  36.0 - 46.0 % Final  . MCV 09/29/2020 92.5  80.0 - 100.0 fL Final  . MCH 09/29/2020 29.7  26.0 - 34.0 pg Final  . MCHC 09/29/2020 32.1  30.0 - 36.0 g/dL Final  . RDW 09/29/2020 12.4  11.5 - 15.5 % Final  . Platelets 09/29/2020 350  150 - 400 K/uL Final  . nRBC 09/29/2020 0.0  0.0 - 0.2 % Final   Performed at [facility], [place] [address] [place], [place] [address]  . Sodium 09/29/2020 140  135 - 145 mmol/L Final  . Potassium 09/29/2020 4.7  3.5 - 5.1 mmol/L Final  . Chloride 09/29/2020 106  98 - 111 mmol/L Final  . CO2 09/29/2020 27  22 - 32 mmol/L Final  . Glucose, Bld 09/29/2020 148* 70 - 99 mg/dL Final   Glucose reference range applies only to samples taken after fasting for at least 8 hours.  . BUN 09/29/2020 8  6 - 20 mg/dL Final  . Creatinine, Ser 09/29/2020 0.84  0.44 - 1.00 mg/dL Final  . Calcium 09/29/2020 9.5  8.9 - 10.3 mg/dL Final  .  Total Protein 09/29/2020 7.9  6.5 - 8.1 g/dL Final  . Albumin 09/29/2020 3.7  3.5 - 5.0 g/dL Final  . AST 09/29/2020 23  15 - 41 U/L Final  . ALT 09/29/2020 13  0 - 44 U/L Final  . Alkaline Phosphatase 09/29/2020 51  38 - 126 U/L Final  . Total Bilirubin 09/29/2020 0.6  0.3 - 1.2 mg/dL Final  . GFR, Estimated 09/29/2020 >60  >60 mL/min Final   Comment: (NOTE) Calculated using the CKD-EPI Creatinine Equation (2021)   . Anion gap 09/29/2020 7  5 - 15 Final   Performed at [facility], [place] [address] [place], [place] [address]  . Prothrombin Time 09/29/2020 13.4  11.4 - 15.2 seconds Final  . INR 09/29/2020 1.1  0.8 - 1.2 Final   Comment: (NOTE) INR goal varies based on device and disease states. Performed at [facility], [place] [address], [place], [place] [address]   . aPTT 09/29/2020 28  24 - 36 seconds Final   Performed at [facility], [place] [address], [place], [place] [address]  . ABO/RH(D) 09/29/2020 A POS   Final  . Antibody Screen 09/29/2020 NEG   Final  . Sample Expiration 09/29/2020 10/09/2020,2359   Final  . Extend sample reason 09/29/2020    Final                   Value:NO TRANSFUSIONS OR PREGNANCY IN THE PAST 3 MONTHS Performed at [facility], [place] [address], [place], [place] [address]   . MRSA, PCR 09/29/2020 NEGATIVE  NEGATIVE Final  . Staphylococcus aureus 09/29/2020 NEGATIVE  NEGATIVE Final   Comment: (NOTE) The Xpert SA Assay (FDA approved for NASAL specimens in patients 59 years of age and older), is one component of a comprehensive surveillance program. It is not intended to diagnose infection nor to guide or monitor treatment. Performed at [facility], [place] [address] [place], [place] [address]      X-Rays:No results found.  EKG: Orders placed or performed during the hospital encounter of 09/29/20  . EKG 12 lead per protocol  . EKG 12 lead per protocol      Hospital Course: [name] is a 55 y.o. who was admitted to [facility]. They were brought to the operating room on 10/06/2020 and underwent Procedure(s): TOTAL KNEE ARTHROPLASTY.  Patient tolerated the procedure well and was later transferred to the recovery room and then to the orthopaedic floor for postoperative care. They were given PO and IV analgesics for pain control following their surgery. They were given 24 hours of postoperative antibiotics of  Anti-infectives (From admission, onward)   Start     Dose/Rate Route Frequency Ordered Stop   10/06/20 1600  ceFAZolin (ANCEF) IVPB 2g/100 mL premix        2 g 200 mL/hr over 30 Minutes Intravenous Every 6 hours 10/06/20 1422 10/06/20 2311   10/06/20 0730  ceFAZolin (ANCEF) IVPB 2g/100 mL premix        2 g 200 mL/hr over 30 Minutes Intravenous On call to O.R. 10/06/20 0725 10/06/20 1010     and started on DVT prophylaxis in the form of Aspirin.   PT and OT were ordered for total joint protocol. Discharge planning consulted to help with postop disposition and equipment needs.  Patient had a good night on the evening of surgery. They started to get up OOB with therapy on POD #1. Pt was seen during rounds and was ready to go home pending progress with therapy.She worked with therapy on POD #1 and was meeting her goals. Pt was discharged to home later that day in stable condition.  Diet: Regular diet Activity: WBAT Follow-up: in 2 weeks Disposition: Home Discharged Condition: good   Discharge Instructions    Call MD / Call 911   Complete by: As directed    If you experience chest pain or shortness of breath, CALL 911 and be transported to the hospital emergency room.  If you develope a fever above 101 F, pus (white drainage) or increased drainage or redness at the wound, or calf pain, call your surgeon's office.   Change dressing  Complete by: As directed    Maintain surgical dressing until follow up in the clinic.  If the edges start to pull up, may reinforce with tape. If the dressing is no longer working, may remove and cover with gauze and tape, but must keep the area dry and clean.  Call with any questions or concerns.   Constipation Prevention   Complete by: As directed    Drink plenty of fluids.  Prune juice may be helpful.  You may use a stool softener, such as Colace (over the counter) 100 mg twice a day.  Use MiraLax (over the counter) for constipation as needed.   Diet - low sodium heart healthy   Complete by: As directed    Discharge instructions   Complete by: As directed    Maintain surgical dressing until follow up in the clinic. If the edges start to pull up, may reinforce with tape. If the dressing is no longer working, may remove and cover with gauze and tape, but must keep the area dry and clean.  Follow up in 2 weeks at [facility]. Call with any questions or concerns.   Increase activity slowly as tolerated   Complete by: As directed    Weight bearing as tolerated with assist device (walker, cane, etc) as directed, use it as long as suggested by your surgeon or therapist, typically at least 4-6 weeks.   TED hose   Complete by: As directed    Use stockings (TED hose) for 2 weeks on both leg(s).  You may remove them at night for sleeping.     Allergies as of 10/07/2020      Reactions   Codeine Shortness Of Breath, Nausea And Vomiting, Palpitations   Shellfish Allergy Swelling   Oxycodone Itching, Palpitations      Medication List    STOP taking these medications   aspirin EC 81 MG tablet Replaced by: Aspirin Low Dose 81 MG chewable tablet     TAKE these medications   Aspirin Low Dose 81 MG chewable tablet Generic drug: aspirin Chew 1 tablet (81 mg total) by mouth 2 (two) times daily for 28 days. Replaces: aspirin EC 81 MG tablet   aspirin-acetaminophen-caffeine 250-250-65 MG tablet Commonly known as: EXCEDRIN MIGRAINE Take 2 tablets by mouth every 6 (six) hours as needed  for headache.   celecoxib 200 MG capsule Commonly known as: CELEBREX Take 1 capsule (200 mg total) by mouth 2 (two) times daily.   docusate sodium 100 MG capsule Commonly known as: COLACE Take 1 capsule (100 mg total) by mouth 2 (two) times daily.   HYDROcodone-acetaminophen 5-325 MG tablet Commonly known as: NORCO/VICODIN Take 1-2 tablets by mouth every 6 (six) hours as needed for severe pain (pain score 7-10).   levothyroxine 112 MCG tablet Commonly known as: SYNTHROID Take 112 mcg by mouth daily before breakfast.   methocarbamol 500 MG tablet Commonly known as: ROBAXIN Take 1 tablet (500 mg total) by mouth every 6 (six) hours as needed for muscle spasms.   polyethylene glycol 17 g packet Commonly known as: MIRALAX / GLYCOLAX Take 17 g by mouth daily as needed for mild constipation.            Discharge Care Instructions  (From admission, onward)         Start     Ordered   10/07/20 0000  Change dressing       Comments: Maintain surgical dressing until follow up in the clinic. If the edges start to  pull up, may reinforce with tape. If the dressing is no longer working, may remove and cover with gauze and tape, but must keep the area dry and clean.  Call with any questions or concerns.   10/07/20 0912          Follow-up Information    Paralee Cancel, MD. Schedule an appointment as soon as possible for a visit in 2 weeks.   Specialty: Orthopedic Surgery Contact information: [address] [place] [address] [phone]               Signed: Griffith Citron, PA-C Orthopedic Surgery 10/12/2020, 3:01 PM

## 2020-10-13 DIAGNOSIS — M25561 Pain in right knee: Secondary | ICD-10-CM | POA: Diagnosis not present

## 2020-10-14 DIAGNOSIS — M25561 Pain in right knee: Secondary | ICD-10-CM | POA: Diagnosis not present

## 2020-10-16 DIAGNOSIS — M25561 Pain in right knee: Secondary | ICD-10-CM | POA: Diagnosis not present

## 2020-10-19 DIAGNOSIS — M25561 Pain in right knee: Secondary | ICD-10-CM | POA: Diagnosis not present

## 2020-10-21 DIAGNOSIS — M25561 Pain in right knee: Secondary | ICD-10-CM | POA: Diagnosis not present

## 2020-10-23 DIAGNOSIS — M25561 Pain in right knee: Secondary | ICD-10-CM | POA: Diagnosis not present

## 2020-10-27 DIAGNOSIS — M25561 Pain in right knee: Secondary | ICD-10-CM | POA: Diagnosis not present

## 2020-10-30 DIAGNOSIS — M25561 Pain in right knee: Secondary | ICD-10-CM | POA: Diagnosis not present

## 2020-11-03 DIAGNOSIS — M25561 Pain in right knee: Secondary | ICD-10-CM | POA: Diagnosis not present

## 2020-11-06 DIAGNOSIS — M25561 Pain in right knee: Secondary | ICD-10-CM | POA: Diagnosis not present

## 2020-11-09 DIAGNOSIS — M25561 Pain in right knee: Secondary | ICD-10-CM | POA: Diagnosis not present

## 2020-11-12 DIAGNOSIS — M25561 Pain in right knee: Secondary | ICD-10-CM | POA: Diagnosis not present

## 2020-11-16 ENCOUNTER — Other Ambulatory Visit (HOSPITAL_COMMUNITY): Payer: Self-pay

## 2020-11-16 DIAGNOSIS — M25561 Pain in right knee: Secondary | ICD-10-CM | POA: Diagnosis not present

## 2020-11-19 DIAGNOSIS — Z471 Aftercare following joint replacement surgery: Secondary | ICD-10-CM | POA: Diagnosis not present

## 2020-11-19 DIAGNOSIS — Z96651 Presence of right artificial knee joint: Secondary | ICD-10-CM | POA: Diagnosis not present

## 2020-11-19 DIAGNOSIS — M25561 Pain in right knee: Secondary | ICD-10-CM | POA: Diagnosis not present

## 2020-11-24 DIAGNOSIS — M25561 Pain in right knee: Secondary | ICD-10-CM | POA: Diagnosis not present

## 2020-12-01 DIAGNOSIS — M25561 Pain in right knee: Secondary | ICD-10-CM | POA: Diagnosis not present

## 2020-12-03 DIAGNOSIS — M25561 Pain in right knee: Secondary | ICD-10-CM | POA: Diagnosis not present

## 2020-12-13 IMAGING — MG DIGITAL SCREENING BILAT W/ TOMO W/ CAD
8 series · 8 of 24 positions shown · non-contrast
Comparison: Previous exam(s).

ACR Breast Density Category a: The breast tissue is almost entirely
fatty.

CLINICAL DATA: Screening.

EXAM:
DIGITAL SCREENING BILATERAL MAMMOGRAM WITH TOMO AND CAD

[L MLO synth-2D]
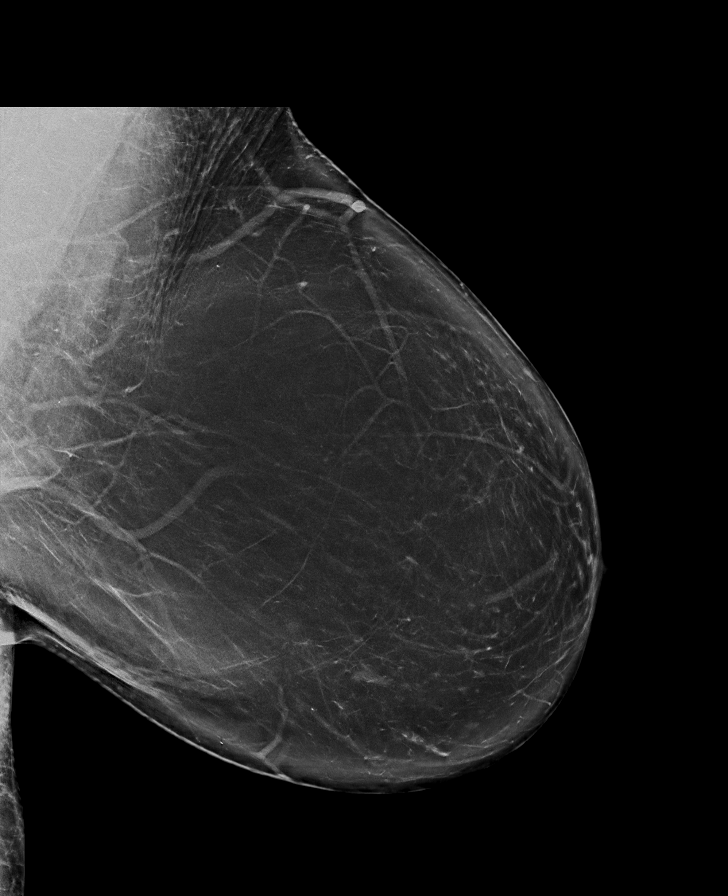

[L CC synth-2D]
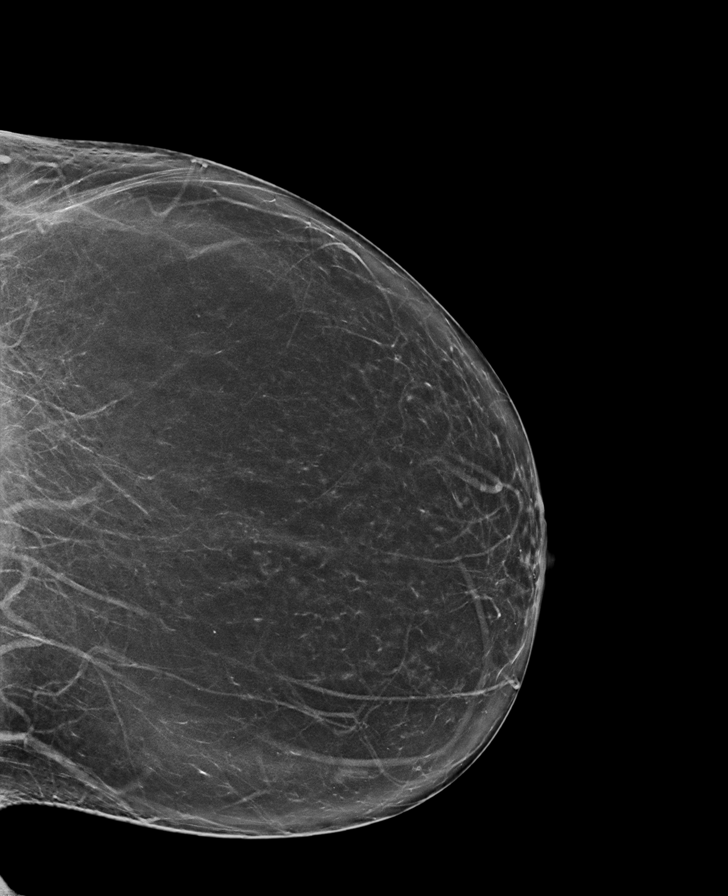

[R CC synth-2D]
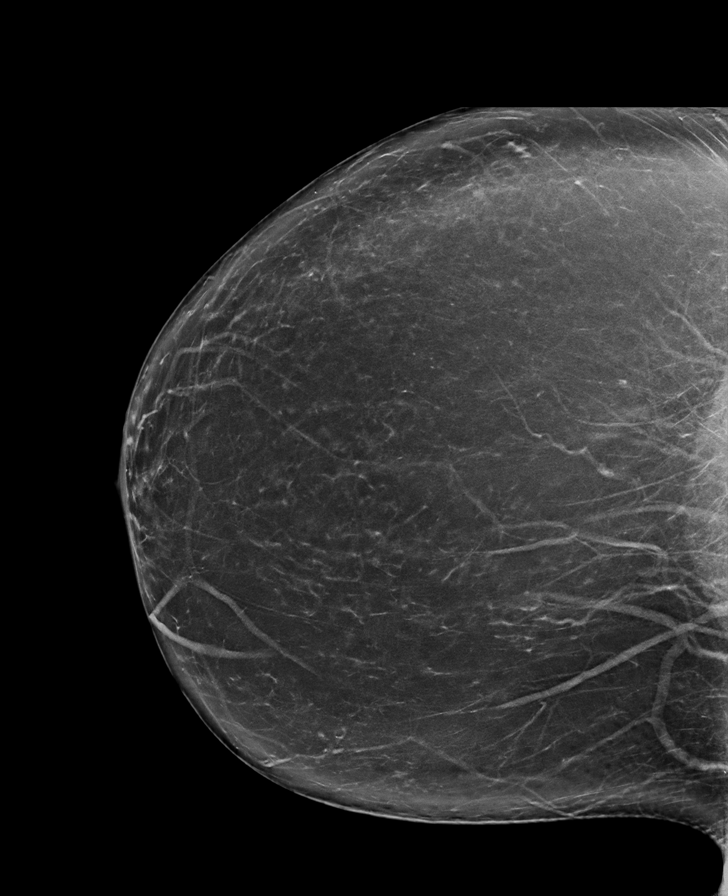

[R MLO synth-2D]
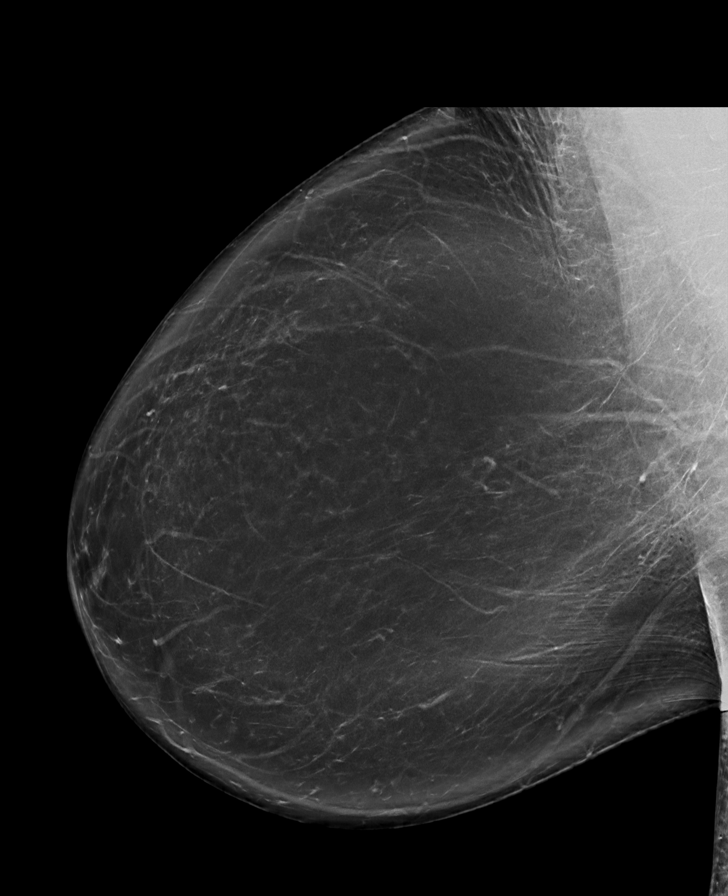

[L CC tomo · tomo slice 38/75.0]
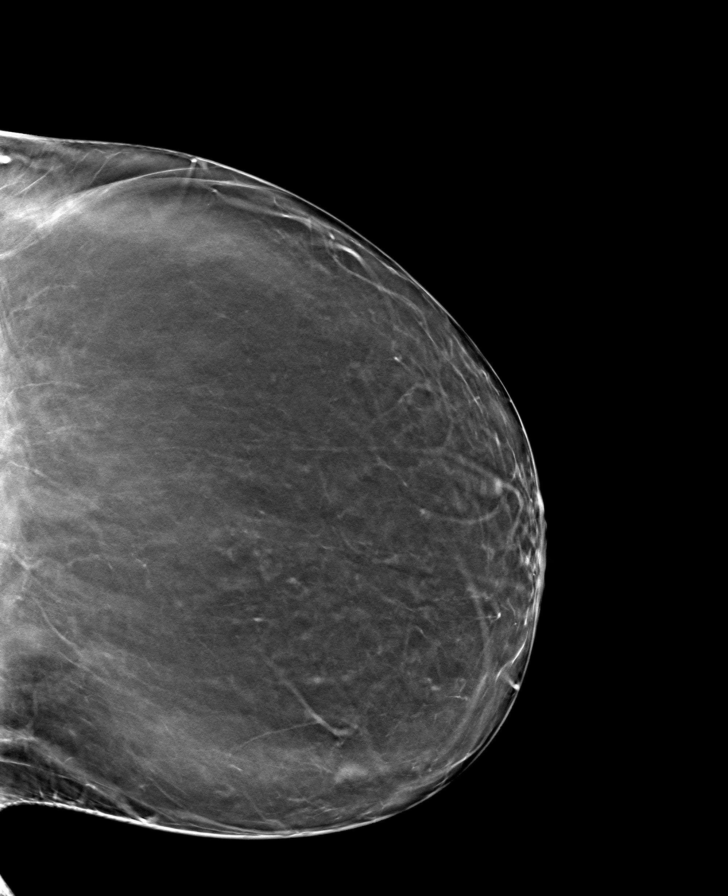

[L MLO tomo · tomo slice 48/95.0]
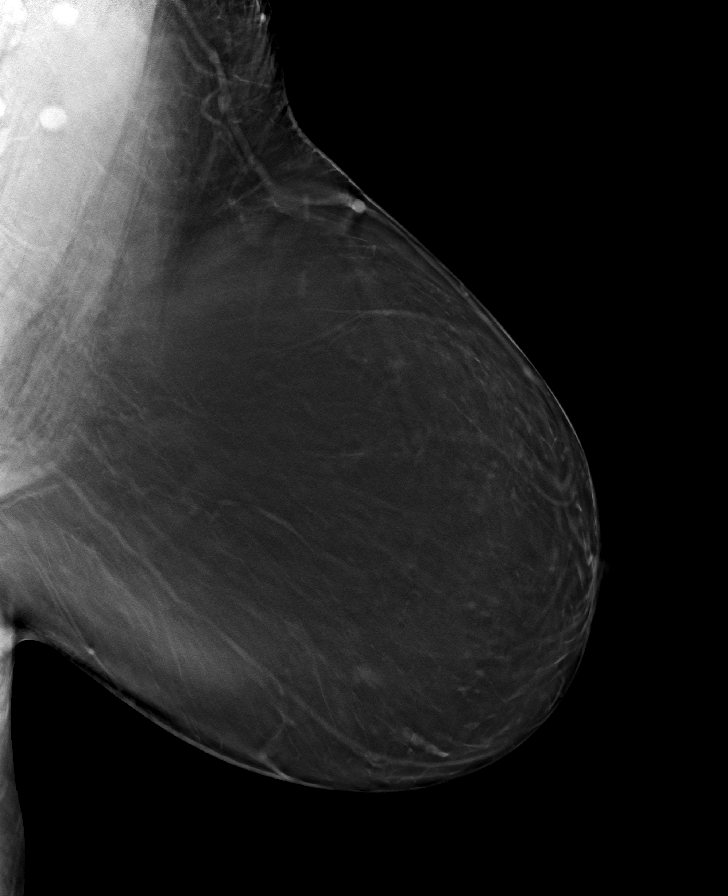

[R CC tomo · tomo slice 41/81.0]
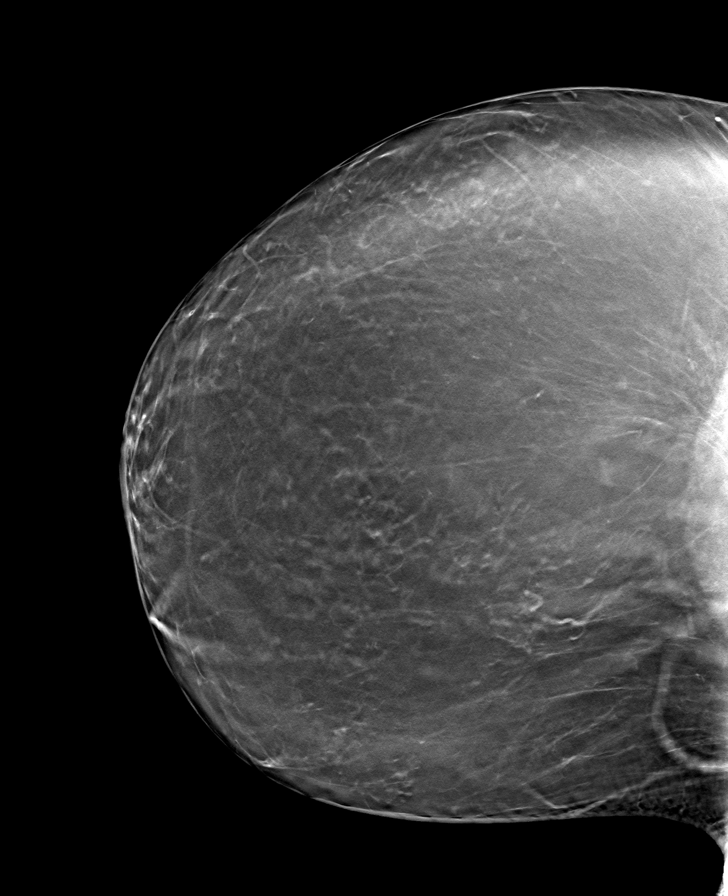

[R MLO tomo · tomo slice 49/98.0]
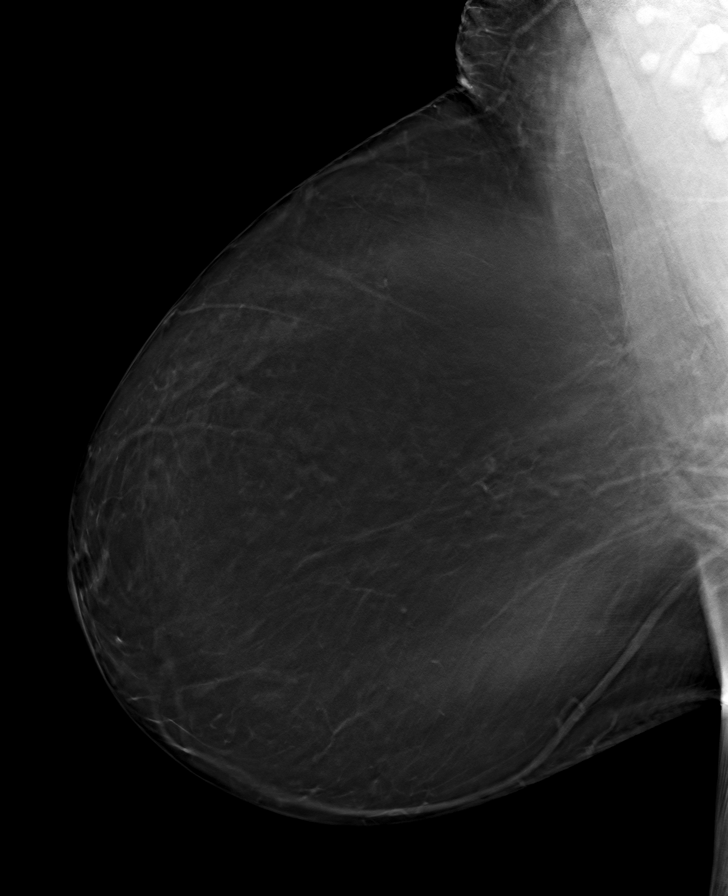

[8 of 24 positions shown; findings below may reference images not displayed]

FINDINGS: There are no findings suspicious for malignancy. Images were
processed with CAD.
IMPRESSION: No mammographic evidence of malignancy. A result letter of this
screening mammogram will be mailed directly to the patient.

RECOMMENDATION:
Screening mammogram in one year. (Code:[id number])

BI-RADS CATEGORY  1: Negative.

## 2021-03-11 DIAGNOSIS — E663 Overweight: Secondary | ICD-10-CM | POA: Diagnosis not present

## 2021-03-11 DIAGNOSIS — E039 Hypothyroidism, unspecified: Secondary | ICD-10-CM | POA: Diagnosis not present

## 2021-03-11 DIAGNOSIS — R739 Hyperglycemia, unspecified: Secondary | ICD-10-CM | POA: Diagnosis not present

## 2021-03-11 DIAGNOSIS — R1030 Lower abdominal pain, unspecified: Secondary | ICD-10-CM | POA: Diagnosis not present

## 2021-04-19 ENCOUNTER — Other Ambulatory Visit: Payer: Self-pay | Admitting: Family Medicine

## 2021-04-19 DIAGNOSIS — R109 Unspecified abdominal pain: Secondary | ICD-10-CM

## 2021-04-26 DIAGNOSIS — E663 Overweight: Secondary | ICD-10-CM | POA: Diagnosis not present

## 2021-04-26 DIAGNOSIS — E039 Hypothyroidism, unspecified: Secondary | ICD-10-CM | POA: Diagnosis not present

## 2021-04-26 DIAGNOSIS — R1030 Lower abdominal pain, unspecified: Secondary | ICD-10-CM | POA: Diagnosis not present

## 2021-04-29 ENCOUNTER — Ambulatory Visit
Admission: RE | Admit: 2021-04-29 | Discharge: 2021-04-29 | Disposition: A | Discharge status: Home / Self Care | Payer: 59 | Source: Ambulatory Visit | Attending: Family Medicine | Admitting: Family Medicine

## 2021-04-29 DIAGNOSIS — R109 Unspecified abdominal pain: Secondary | ICD-10-CM

## 2021-04-30 ENCOUNTER — Other Ambulatory Visit: Payer: 59

## 2021-07-23 ENCOUNTER — Other Ambulatory Visit: Payer: Self-pay | Admitting: Family Medicine

## 2021-07-23 DIAGNOSIS — Z1231 Encounter for screening mammogram for malignant neoplasm of breast: Secondary | ICD-10-CM

## 2021-08-23 ENCOUNTER — Ambulatory Visit
Admission: RE | Admit: 2021-08-23 | Discharge: 2021-08-23 | Disposition: A | Discharge status: Home / Self Care | Payer: 59 | Source: Ambulatory Visit | Attending: Family Medicine | Admitting: Family Medicine

## 2021-08-23 DIAGNOSIS — Z1231 Encounter for screening mammogram for malignant neoplasm of breast: Secondary | ICD-10-CM

## 2022-01-13 ENCOUNTER — Ambulatory Visit: Payer: Self-pay | Admitting: Surgery

## 2022-01-13 NOTE — H&P (Signed)
[name] [id number]   Referring Provider:  Juanita Craver, MD   Subjective   Chief Complaint: gallbladder     History of Present Illness:    56 year old woman with history of hypothyroidism, chronic constipation, obesity, ulcerative colitis/proctitis who is referred by [name] for cholelithiasis and hepatic steatosis noted on recent right upper quadrant ultrasound.  Per notes, she had been having some right upper quadrant and epigastric pain with associated nausea, exacerbated by greasy food.  Ultrasound from November 2022 shows multiple gallstones up to 1.4 cm, without sonographic evidence of cholecystitis.  Common bile duct 3.1 mm; LFTs normal as of last month.  Per the patient, she has had intermittent abdominal pain for quite some time, but this has been worse over the last 2 months.  She reports epigastric pain with eating, gestures to her left subcostal region and down the left side of her abdomen but on further questioning does report pain in the right upper quadrant.  Denies nausea or indigestion, no vomiting.  Previous abdominal surgery includes C-section, tubal ligation, She works down the street at [employer], and can work from home.   Review of Systems: A complete review of systems was obtained from the patient.  I have reviewed this information and discussed as appropriate with the patient.  See HPI as well for other ROS.   Medical History: Past Medical History:  Diagnosis Date   Arthritis    Thyroid disease     There is no problem list on file for this patient.   Past Surgical History:  Procedure Laterality Date   left hip replacement Left    right knee surgery N/A      Allergies  Allergen Reactions   Codeine Nausea And Vomiting, Palpitations and Shortness Of Breath   Shellfish Containing Products Swelling   Oxycodone Itching and Palpitations    Current Outpatient Medications on File Prior to Visit  Medication Sig Dispense Refill   levothyroxine (SYNTHROID)  112 MCG tablet Take 112 mcg by mouth once daily     No current facility-administered medications on file prior to visit.    Family History  Problem Relation Age of Onset   High blood pressure (Hypertension) Mother    Diabetes Mother    Diabetes Father      Social History   Tobacco Use  Smoking Status Former   Types: Cigarettes  Smokeless Tobacco Never     Social History   Socioeconomic History   Marital status: Divorced  Tobacco Use   Smoking status: Former    Types: Cigarettes   Smokeless tobacco: Never  Vaping Use   Vaping Use: Never used  Substance and Sexual Activity   Alcohol use: Never   Drug use: Never    Objective:    Vitals:   01/13/22 1040  BP: (!) 142/98  Pulse: 97  Temp: 36.9 C (98.4 F)  SpO2: 96%  Weight: (!) 103.6 kg (228 lb 6.4 oz)  Height: 167.6 cm ('5\' 6"'$ )    Body mass index is 36.86 kg/m.  Alert, well-appearing Unlabored respirations Abdomen is soft, obese, mildly tender in the epigastric and right subcostal margin  Assessment and Plan:  Diagnoses and all orders for this visit:  Biliary colic   I recommend proceeding with laparoscopic or robotic cholecystectomy with possible cholangiogram.  Discussed the relevant anatomy using a diagram to demonstrate, and went over surgical technique.  Discussed risks of surgery including bleeding, pain, scarring, intraabdominal injury specifically to the common bile duct and sequelae, bile  leak, conversion to open surgery, subtotal cholecystectomy, failure to resolve symptoms, blood clots/ pulmonary embolus, heart attack, pneumonia, stroke, death. Questions welcomed and answered to patient's satisfaction.   Gabrian Hoque Raquel James, MD

## 2022-01-13 NOTE — H&P (View-Only) (Signed)
[name] [id number]   Referring Provider:  Juanita Craver, MD   Subjective   Chief Complaint: gallbladder     History of Present Illness:    56 year old woman with history of hypothyroidism, chronic constipation, obesity, ulcerative colitis/proctitis who is referred by Dr. Collene Mares for cholelithiasis and hepatic steatosis noted on recent right upper quadrant ultrasound.  Per notes, she had been having some right upper quadrant and epigastric pain with associated nausea, exacerbated by greasy food.  Ultrasound from November 2022 shows multiple gallstones up to 1.4 cm, without sonographic evidence of cholecystitis.  Common bile duct 3.1 mm; LFTs normal as of last month.  Per the patient, she has had intermittent abdominal pain for quite some time, but this has been worse over the last 2 months.  She reports epigastric pain with eating, gestures to her left subcostal region and down the left side of her abdomen but on further questioning does report pain in the right upper quadrant.  Denies nausea or indigestion, no vomiting.  Previous abdominal surgery includes C-section, tubal ligation, She works down the street at [employer], and can work from home.   Review of Systems: A complete review of systems was obtained from the patient.  I have reviewed this information and discussed as appropriate with the patient.  See HPI as well for other ROS.   Medical History: Past Medical History:  Diagnosis Date   Arthritis    Thyroid disease     There is no problem list on file for this patient.   Past Surgical History:  Procedure Laterality Date   left hip replacement Left    right knee surgery N/A      Allergies  Allergen Reactions   Codeine Nausea And Vomiting, Palpitations and Shortness Of Breath   Shellfish Containing Products Swelling   Oxycodone Itching and Palpitations    Current Outpatient Medications on File Prior to Visit  Medication Sig Dispense Refill   levothyroxine (SYNTHROID)  112 MCG tablet Take 112 mcg by mouth once daily     No current facility-administered medications on file prior to visit.    Family History  Problem Relation Age of Onset   High blood pressure (Hypertension) Mother    Diabetes Mother    Diabetes Father      Social History   Tobacco Use  Smoking Status Former   Types: Cigarettes  Smokeless Tobacco Never     Social History   Socioeconomic History   Marital status: Divorced  Tobacco Use   Smoking status: Former    Types: Cigarettes   Smokeless tobacco: Never  Vaping Use   Vaping Use: Never used  Substance and Sexual Activity   Alcohol use: Never   Drug use: Never    Objective:    Vitals:   01/13/22 1040  BP: (!) 142/98  Pulse: 97  Temp: 36.9 C (98.4 F)  SpO2: 96%  Weight: (!) 103.6 kg (228 lb 6.4 oz)  Height: 167.6 cm ('5\' 6"'$ )    Body mass index is 36.86 kg/m.  Alert, well-appearing Unlabored respirations Abdomen is soft, obese, mildly tender in the epigastric and right subcostal margin  Assessment and Plan:  Diagnoses and all orders for this visit:  Biliary colic   I recommend proceeding with laparoscopic or robotic cholecystectomy with possible cholangiogram.  Discussed the relevant anatomy using a diagram to demonstrate, and went over surgical technique.  Discussed risks of surgery including bleeding, pain, scarring, intraabdominal injury specifically to the common bile duct and sequelae, bile  leak, conversion to open surgery, subtotal cholecystectomy, failure to resolve symptoms, blood clots/ pulmonary embolus, heart attack, pneumonia, stroke, death. Questions welcomed and answered to patient's satisfaction.   Brittney Lawn Raquel James, MD

## 2022-02-01 NOTE — Patient Instructions (Signed)
SURGICAL WAITING ROOM VISITATION Patients having surgery or a procedure may have no more than 2 support people in the waiting area - these visitors may rotate.   Children under the age of 69 must have an adult with them who is not the patient. If the patient needs to stay at the hospital during part of their recovery, the visitor guidelines for inpatient rooms apply. Pre-op nurse will coordinate an appropriate time for 1 support person to accompany patient in pre-op.  This support person may not rotate.    Please refer to the [facility] website for the visitor guidelines for Inpatients (after your surgery is over and you are in a regular room).       Your procedure is scheduled on:  02/09/2022    Report to [facility] Main Entrance    Report to admitting at   0800AM   Call this number if you have problems the morning of surgery [phone]   Do not eat food :After Midnight.   After Midnight you may have the following liquids until ____ 0700__ AM DAY OF SURGERY  Water Non-Citrus Juices (without pulp, NO RED) Carbonated Beverages Black Coffee (NO MILK/CREAM OR CREAMERS, sugar ok)  Clear Tea (NO MILK/CREAM OR CREAMERS, sugar ok) regular and decaf                             Plain Jell-O (NO RED)                                           Fruit ices (not with fruit pulp, NO RED)                                     Popsicles (NO RED)                                                               Sports drinks like Gatorade (NO RED)                      Oral Hygiene is also important to reduce your risk of infection.                                    Remember - BRUSH YOUR TEETH THE MORNING OF SURGERY WITH YOUR REGULAR TOOTHPASTE   Do NOT smoke after Midnight   Take these medicines the morning of surgery with A SIP OF WATER:  synthroid   DO NOT TAKE ANY ORAL DIABETIC MEDICATIONS DAY OF YOUR SURGERY  Bring CPAP mask and tubing day of surgery.                               You may not have any metal on your body including hair pins, jewelry, and body piercing             Do not wear make-up, lotions, powders, perfumes/cologne, or deodorant  Do not wear nail polish including gel and S&S, artificial/acrylic nails, or any other type of covering on natural nails including finger and toenails. If you have artificial nails, gel coating, etc. that needs to be removed by a nail salon please have this removed prior to surgery or surgery may need to be canceled/ delayed if the surgeon/ anesthesia feels like they are unable to be safely monitored.   Do not shave  48 hours prior to surgery.               Men may shave face and neck.   Do not bring valuables to the hospital. [place].   Contacts, dentures or bridgework may not be worn into surgery.   Bring small overnight bag day of surgery.   DO NOT [place]. PHARMACY WILL DISPENSE MEDICATIONS LISTED ON YOUR MEDICATION LIST TO YOU DURING YOUR ADMISSION [place]!    Patients discharged on the day of surgery will not be allowed to drive home.  Someone NEEDS to stay with you for the first 24 hours after anesthesia.   Special Instructions: Bring a copy of your healthcare power of attorney and living will documents         the day of surgery if you haven't scanned them before.              Please read over the following fact sheets you were given: IF YOU HAVE QUESTIONS ABOUT YOUR PRE-OP INSTRUCTIONS PLEASE CALL [phone]     [facility] - Preparing for Surgery Before surgery, you can play an important role.  Because skin is not sterile, your skin needs to be as free of germs as possible.  You can reduce the number of germs on your skin by washing with CHG (chlorahexidine gluconate) soap before surgery.  CHG is an antiseptic cleaner which kills germs and bonds with the skin to continue killing germs even after washing. Please DO  NOT use if you have an allergy to CHG or antibacterial soaps.  If your skin becomes reddened/irritated stop using the CHG and inform your nurse when you arrive at Short Stay. Do not shave (including legs and underarms) for at least 48 hours prior to the first CHG shower.  You may shave your face/neck. Please follow these instructions carefully:  1.  Shower with CHG Soap the night before surgery and the  morning of Surgery.  2.  If you choose to wash your hair, wash your hair first as usual with your  normal  shampoo.  3.  After you shampoo, rinse your hair and body thoroughly to remove the  shampoo.                           4.  Use CHG as you would any other liquid soap.  You can apply chg directly  to the skin and wash                       Gently with a scrungie or clean washcloth.  5.  Apply the CHG Soap to your body ONLY FROM THE NECK DOWN.   Do not use on face/ open  Wound or open sores. Avoid contact with eyes, ears mouth and genitals (private parts).                       Wash face,  Genitals (private parts) with your normal soap.             6.  Wash thoroughly, paying special attention to the area where your surgery  will be performed.  7.  Thoroughly rinse your body with warm water from the neck down.  8.  DO NOT shower/wash with your normal soap after using and rinsing off  the CHG Soap.                9.  Pat yourself dry with a clean towel.            10.  Wear clean pajamas.            11.  Place clean sheets on your bed the night of your first shower and do not  sleep with pets. Day of Surgery : Do not apply any lotions/deodorants the morning of surgery.  Please wear clean clothes to the hospital/surgery center.  FAILURE TO FOLLOW THESE INSTRUCTIONS MAY RESULT IN THE CANCELLATION OF YOUR SURGERY PATIENT SIGNATURE_________________________________  NURSE  SIGNATURE__________________________________  ________________________________________________________________________

## 2022-02-01 NOTE — Progress Notes (Signed)
Anesthesia Review:  PCP: Cardiologist : Chest x-ray : EKG : Echo : Stress test: Cardiac Cath :  Activity level:  Sleep Study/ CPAP : Fasting Blood Sugar :      / Checks Blood Sugar -- times a day:   Blood Thinner/ Instructions /Last Dose: ASA / Instructions/ Last Dose :  

## 2022-02-02 ENCOUNTER — Other Ambulatory Visit: Payer: Self-pay

## 2022-02-02 ENCOUNTER — Encounter (HOSPITAL_COMMUNITY)
Admission: RE | Admit: 2022-02-02 | Discharge: 2022-02-02 | Disposition: A | Discharge status: Home / Self Care | Payer: 59 | Source: Ambulatory Visit | Attending: Surgery | Admitting: Surgery

## 2022-02-02 ENCOUNTER — Encounter (HOSPITAL_COMMUNITY): Payer: Self-pay

## 2022-02-02 VITALS — BP 140/100 | HR 71 | Temp 98.2°F | Resp 16 | Ht 66.0 in | Wt 225.0 lb

## 2022-02-02 DIAGNOSIS — Z01812 Encounter for preprocedural laboratory examination: Secondary | ICD-10-CM | POA: Insufficient documentation

## 2022-02-02 DIAGNOSIS — Z01818 Encounter for other preprocedural examination: Secondary | ICD-10-CM

## 2022-02-02 LAB — CBC
HCT: 38.9 % (ref 36.0–46.0)
Hemoglobin: 12.6 g/dL (ref 12.0–15.0)
MCH: 29.6 pg (ref 26.0–34.0)
MCHC: 32.4 g/dL (ref 30.0–36.0)
MCV: 91.3 fL (ref 80.0–100.0)
Platelets: 371 10*3/uL (ref 150–400)
RBC: 4.26 MIL/uL (ref 3.87–5.11)
RDW: 12.8 % (ref 11.5–15.5)
WBC: 8.1 10*3/uL (ref 4.0–10.5)
nRBC: 0 % (ref 0.0–0.2)

## 2022-02-09 ENCOUNTER — Other Ambulatory Visit: Payer: Self-pay

## 2022-02-09 ENCOUNTER — Encounter (HOSPITAL_COMMUNITY): Payer: Self-pay | Admitting: Surgery

## 2022-02-09 ENCOUNTER — Ambulatory Visit (HOSPITAL_COMMUNITY)
Admission: RE | Admit: 2022-02-09 | Discharge: 2022-02-09 | Disposition: A | Discharge status: Home / Self Care | Payer: 59 | Attending: Surgery | Admitting: Surgery

## 2022-02-09 ENCOUNTER — Encounter (HOSPITAL_COMMUNITY)
Admission: RE | Disposition: A | Discharge status: Home / Self Care | Payer: Self-pay | Source: Home / Self Care | Attending: Surgery

## 2022-02-09 ENCOUNTER — Ambulatory Visit (HOSPITAL_COMMUNITY): Payer: 59 | Admitting: Anesthesiology

## 2022-02-09 ENCOUNTER — Ambulatory Visit (HOSPITAL_BASED_OUTPATIENT_CLINIC_OR_DEPARTMENT_OTHER): Payer: 59 | Admitting: Anesthesiology

## 2022-02-09 DIAGNOSIS — K760 Fatty (change of) liver, not elsewhere classified: Secondary | ICD-10-CM | POA: Insufficient documentation

## 2022-02-09 DIAGNOSIS — Z6836 Body mass index (BMI) 36.0-36.9, adult: Secondary | ICD-10-CM | POA: Insufficient documentation

## 2022-02-09 DIAGNOSIS — E039 Hypothyroidism, unspecified: Secondary | ICD-10-CM | POA: Insufficient documentation

## 2022-02-09 DIAGNOSIS — K8044 Calculus of bile duct with chronic cholecystitis without obstruction: Secondary | ICD-10-CM | POA: Diagnosis present

## 2022-02-09 DIAGNOSIS — N8003 Adenomyosis of the uterus: Secondary | ICD-10-CM | POA: Diagnosis not present

## 2022-02-09 DIAGNOSIS — Z87891 Personal history of nicotine dependence: Secondary | ICD-10-CM | POA: Diagnosis not present

## 2022-02-09 DIAGNOSIS — Z01818 Encounter for other preprocedural examination: Secondary | ICD-10-CM

## 2022-02-09 HISTORY — PX: CHOLECYSTECTOMY: SHX55

## 2022-02-09 SURGERY — LAPAROSCOPIC CHOLECYSTECTOMY
Anesthesia: General

## 2022-02-09 MED ORDER — MIDAZOLAM HCL 2 MG/2ML IJ SOLN
INTRAMUSCULAR | Status: DC | PRN
  Administered 2022-02-09: 2 mg via INTRAVENOUS

## 2022-02-09 MED ORDER — FENTANYL CITRATE (PF) 250 MCG/5ML IJ SOLN
INTRAMUSCULAR | Status: AC
  Filled 2022-02-09: qty 5

## 2022-02-09 MED ORDER — BUPIVACAINE-EPINEPHRINE (PF) 0.5% -1:200000 IJ SOLN
INTRAMUSCULAR | Status: DC | PRN
  Administered 2022-02-09: 30 mL

## 2022-02-09 MED ORDER — FENTANYL CITRATE PF 50 MCG/ML IJ SOSY: 25.0000 ug | PREFILLED_SYRINGE | INTRAMUSCULAR | Status: DC | PRN

## 2022-02-09 MED ORDER — ACETAMINOPHEN 650 MG RE SUPP: 650.0000 mg | RECTAL | Status: DC | PRN

## 2022-02-09 MED ORDER — LACTATED RINGERS IV SOLN: INTRAVENOUS | Status: DC

## 2022-02-09 MED ORDER — LIDOCAINE 2% (20 MG/ML) 5 ML SYRINGE
INTRAMUSCULAR | Status: DC | PRN
  Administered 2022-02-09: 60 mg via INTRAVENOUS

## 2022-02-09 MED ORDER — ONDANSETRON HCL 4 MG/2ML IJ SOLN
INTRAMUSCULAR | Status: DC | PRN
  Administered 2022-02-09: 4 mg via INTRAVENOUS

## 2022-02-09 MED ORDER — ONDANSETRON HCL 4 MG/2ML IJ SOLN
INTRAMUSCULAR | Status: AC
  Filled 2022-02-09: qty 2

## 2022-02-09 MED ORDER — ACETAMINOPHEN 325 MG PO TABS: 650.0000 mg | ORAL_TABLET | ORAL | Status: DC | PRN

## 2022-02-09 MED ORDER — DEXAMETHASONE SODIUM PHOSPHATE 10 MG/ML IJ SOLN
INTRAMUSCULAR | Status: AC
  Filled 2022-02-09: qty 1

## 2022-02-09 MED ORDER — MIDAZOLAM HCL 2 MG/2ML IJ SOLN
INTRAMUSCULAR | Status: AC
  Filled 2022-02-09: qty 2

## 2022-02-09 MED ORDER — ORAL CARE MOUTH RINSE: 15.0000 mL | Freq: Once | OROMUCOSAL | Status: AC

## 2022-02-09 MED ORDER — TRAMADOL HCL 50 MG PO TABS: 50.0000 mg | ORAL_TABLET | Freq: Four times a day (QID) | ORAL | 0 refills | Status: AC | PRN

## 2022-02-09 MED ORDER — CHLORHEXIDINE GLUCONATE 4 % EX LIQD: 60.0000 mL | Freq: Once | CUTANEOUS | Status: DC

## 2022-02-09 MED ORDER — PROPOFOL 10 MG/ML IV BOLUS
INTRAVENOUS | Status: AC
  Filled 2022-02-09: qty 20

## 2022-02-09 MED ORDER — ROCURONIUM BROMIDE 10 MG/ML (PF) SYRINGE
PREFILLED_SYRINGE | INTRAVENOUS | Status: AC
  Filled 2022-02-09: qty 10

## 2022-02-09 MED ORDER — BUPIVACAINE LIPOSOME 1.3 % IJ SUSP: 20.0000 mL | Freq: Once | INTRAMUSCULAR | Status: DC

## 2022-02-09 MED ORDER — 0.9 % SODIUM CHLORIDE (POUR BTL) OPTIME
TOPICAL | Status: DC | PRN
  Administered 2022-02-09: 1000 mL

## 2022-02-09 MED ORDER — SODIUM CHLORIDE (PF) 0.9 % IJ SOLN
INTRAMUSCULAR | Status: AC
  Filled 2022-02-09: qty 10

## 2022-02-09 MED ORDER — ROCURONIUM BROMIDE 10 MG/ML (PF) SYRINGE
PREFILLED_SYRINGE | INTRAVENOUS | Status: DC | PRN
  Administered 2022-02-09: 50 mg via INTRAVENOUS

## 2022-02-09 MED ORDER — CEFAZOLIN SODIUM-DEXTROSE 2-4 GM/100ML-% IV SOLN
2.0000 g | INTRAVENOUS | Status: AC
  Administered 2022-02-09: 2 g via INTRAVENOUS
  Filled 2022-02-09: qty 100

## 2022-02-09 MED ORDER — HYDRALAZINE HCL 20 MG/ML IJ SOLN
INTRAMUSCULAR | Status: DC | PRN
  Administered 2022-02-09: 2.5 mg via INTRAVENOUS

## 2022-02-09 MED ORDER — SUGAMMADEX SODIUM 500 MG/5ML IV SOLN
INTRAVENOUS | Status: AC
  Filled 2022-02-09: qty 5

## 2022-02-09 MED ORDER — AMISULPRIDE (ANTIEMETIC) 5 MG/2ML IV SOLN
INTRAVENOUS | Status: AC
  Filled 2022-02-09: qty 4

## 2022-02-09 MED ORDER — BUPIVACAINE LIPOSOME 1.3 % IJ SUSP
INTRAMUSCULAR | Status: AC
  Filled 2022-02-09: qty 20

## 2022-02-09 MED ORDER — TRAMADOL HCL 50 MG PO TABS: 50.0000 mg | ORAL_TABLET | Freq: Four times a day (QID) | ORAL | Status: DC | PRN

## 2022-02-09 MED ORDER — SODIUM CHLORIDE 0.9 % IV SOLN: 250.0000 mL | INTRAVENOUS | Status: DC | PRN

## 2022-02-09 MED ORDER — BUPIVACAINE LIPOSOME 1.3 % IJ SUSP
INTRAMUSCULAR | Status: DC | PRN
  Administered 2022-02-09: 20 mL

## 2022-02-09 MED ORDER — LABETALOL HCL 5 MG/ML IV SOLN
INTRAVENOUS | Status: DC | PRN
  Administered 2022-02-09 (×3): 5 mg via INTRAVENOUS

## 2022-02-09 MED ORDER — CHLORHEXIDINE GLUCONATE 0.12 % MT SOLN
15.0000 mL | Freq: Once | OROMUCOSAL | Status: AC
  Administered 2022-02-09: 15 mL via OROMUCOSAL

## 2022-02-09 MED ORDER — ACETAMINOPHEN 500 MG PO TABS
1000.0000 mg | ORAL_TABLET | ORAL | Status: AC
  Administered 2022-02-09: 1000 mg via ORAL
  Filled 2022-02-09: qty 2

## 2022-02-09 MED ORDER — LABETALOL HCL 5 MG/ML IV SOLN
INTRAVENOUS | Status: AC
  Filled 2022-02-09: qty 4

## 2022-02-09 MED ORDER — DEXAMETHASONE SODIUM PHOSPHATE 10 MG/ML IJ SOLN
INTRAMUSCULAR | Status: DC | PRN
  Administered 2022-02-09: 10 mg via INTRAVENOUS

## 2022-02-09 MED ORDER — HYDRALAZINE HCL 20 MG/ML IJ SOLN
INTRAMUSCULAR | Status: AC
  Filled 2022-02-09: qty 1

## 2022-02-09 MED ORDER — LACTATED RINGERS IR SOLN
Status: DC | PRN
  Administered 2022-02-09: 1000 mL

## 2022-02-09 MED ORDER — AMISULPRIDE (ANTIEMETIC) 5 MG/2ML IV SOLN
10.0000 mg | Freq: Once | INTRAVENOUS | Status: AC
Start: 2022-02-09 — End: 2022-02-09
  Administered 2022-02-09: 10 mg via INTRAVENOUS

## 2022-02-09 MED ORDER — SODIUM CHLORIDE 0.9% FLUSH: 3.0000 mL | INTRAVENOUS | Status: DC | PRN

## 2022-02-09 MED ORDER — PROPOFOL 10 MG/ML IV BOLUS
INTRAVENOUS | Status: DC | PRN
  Administered 2022-02-09: 150 mg via INTRAVENOUS

## 2022-02-09 MED ORDER — LIDOCAINE 2% (20 MG/ML) 5 ML SYRINGE
INTRAMUSCULAR | Status: AC
  Filled 2022-02-09: qty 5

## 2022-02-09 MED ORDER — FENTANYL CITRATE (PF) 250 MCG/5ML IJ SOLN
INTRAMUSCULAR | Status: DC | PRN
  Administered 2022-02-09: 100 ug via INTRAVENOUS
  Administered 2022-02-09 (×2): 50 ug via INTRAVENOUS

## 2022-02-09 MED ORDER — BUPIVACAINE-EPINEPHRINE (PF) 0.5% -1:200000 IJ SOLN
INTRAMUSCULAR | Status: AC
  Filled 2022-02-09: qty 30

## 2022-02-09 MED ORDER — SODIUM CHLORIDE 0.9% FLUSH: 3.0000 mL | Freq: Two times a day (BID) | INTRAVENOUS | Status: DC

## 2022-02-09 MED ORDER — GABAPENTIN 300 MG PO CAPS
300.0000 mg | ORAL_CAPSULE | ORAL | Status: AC
  Administered 2022-02-09: 300 mg via ORAL
  Filled 2022-02-09: qty 1

## 2022-02-09 MED ORDER — HYDROMORPHONE HCL 1 MG/ML IJ SOLN: 0.2500 mg | INTRAMUSCULAR | Status: DC | PRN

## 2022-02-09 MED ORDER — SUGAMMADEX SODIUM 500 MG/5ML IV SOLN
INTRAVENOUS | Status: DC | PRN
  Administered 2022-02-09: 220 mg via INTRAVENOUS

## 2022-02-09 MED ORDER — INDOCYANINE GREEN 25 MG IV SOLR
2.5000 mg | Freq: Once | INTRAVENOUS | Status: DC
  Filled 2022-02-09: qty 10

## 2022-02-09 SURGICAL SUPPLY — 46 items
ADH SKN CLS APL DERMABOND .7 (GAUZE/BANDAGES/DRESSINGS) ×1
APL PRP STRL LF DISP 70% ISPRP (MISCELLANEOUS) ×1
APPLIER CLIP ROT 10 11.4 M/L (STAPLE) ×2
APR CLP MED LRG 11.4X10 (STAPLE) ×1
BAG COUNTER SPONGE SURGICOUNT (BAG) IMPLANT
BAG SPNG CNTER NS LX DISP (BAG)
CABLE HIGH FREQUENCY MONO STRZ (ELECTRODE) ×2 IMPLANT
CHLORAPREP W/TINT 26 (MISCELLANEOUS) ×2 IMPLANT
CLIP APPLIE ROT 10 11.4 M/L (STAPLE) ×1 IMPLANT
COVER MAYO STAND STRL (DRAPES) IMPLANT
COVER SURGICAL LIGHT HANDLE (MISCELLANEOUS) ×2 IMPLANT
DERMABOND ADVANCED (GAUZE/BANDAGES/DRESSINGS) ×1
DERMABOND ADVANCED .7 DNX12 (GAUZE/BANDAGES/DRESSINGS) ×1 IMPLANT
DRAPE C-ARM 42X120 X-RAY (DRAPES) IMPLANT
ELECT REM PT RETURN 15FT ADLT (MISCELLANEOUS) ×2 IMPLANT
GLOVE BIO SURGEON STRL SZ 6 (GLOVE) ×2 IMPLANT
GLOVE INDICATOR 6.5 STRL GRN (GLOVE) ×2 IMPLANT
GLOVE SS BIOGEL STRL SZ 6 (GLOVE) ×1 IMPLANT
GLOVE SUPERSENSE BIOGEL SZ 6 (GLOVE) ×1
GOWN STRL REUS W/ TWL LRG LVL3 (GOWN DISPOSABLE) ×1 IMPLANT
GOWN STRL REUS W/ TWL XL LVL3 (GOWN DISPOSABLE) IMPLANT
GOWN STRL REUS W/TWL LRG LVL3 (GOWN DISPOSABLE) ×2
GOWN STRL REUS W/TWL XL LVL3 (GOWN DISPOSABLE)
GRASPER SUT TROCAR 14GX15 (MISCELLANEOUS) ×2 IMPLANT
HEMOSTAT SNOW SURGICEL 2X4 (HEMOSTASIS) IMPLANT
IRRIG SUCT STRYKERFLOW 2 WTIP (MISCELLANEOUS) ×2
IRRIGATION SUCT STRKRFLW 2 WTP (MISCELLANEOUS) ×1 IMPLANT
KIT BASIN OR (CUSTOM PROCEDURE TRAY) ×2 IMPLANT
KIT TURNOVER KIT A (KITS) IMPLANT
NDL INSUFFLATION 14GA 120MM (NEEDLE) ×1 IMPLANT
NEEDLE INSUFFLATION 14GA 120MM (NEEDLE) ×2 IMPLANT
PENCIL SMOKE EVACUATOR (MISCELLANEOUS) IMPLANT
SCISSORS LAP 5X35 DISP (ENDOMECHANICALS) ×2 IMPLANT
SET CHOLANGIOGRAPH MIX (MISCELLANEOUS) IMPLANT
SET TUBE SMOKE EVAC HIGH FLOW (TUBING) ×2 IMPLANT
SLEEVE Z-THREAD 5X100MM (TROCAR) ×2 IMPLANT
SPIKE FLUID TRANSFER (MISCELLANEOUS) ×2 IMPLANT
SUT MNCRL AB 4-0 PS2 18 (SUTURE) ×2 IMPLANT
SYS BAG RETRIEVAL 10MM (BASKET) ×2
SYSTEM BAG RETRIEVAL 10MM (BASKET) ×1 IMPLANT
TOWEL OR 17X26 10 PK STRL BLUE (TOWEL DISPOSABLE) ×2 IMPLANT
TOWEL OR NON WOVEN STRL DISP B (DISPOSABLE) IMPLANT
TRAY LAPAROSCOPIC (CUSTOM PROCEDURE TRAY) ×2 IMPLANT
TROCAR ADV FIXATION 12X100MM (TROCAR) ×2 IMPLANT
TROCAR XCEL NON-BLD 5MMX100MML (ENDOMECHANICALS) IMPLANT
TROCAR Z-THREAD OPTICAL 5X100M (TROCAR) ×2 IMPLANT

## 2022-02-09 NOTE — Interval H&P Note (Signed)
History and Physical Interval Note:  02/09/2022 8:57 AM  [name]  has presented today for surgery, with the diagnosis of BILIARY COLIC.  The various methods of treatment have been discussed with the patient and family. After consideration of risks, benefits and other options for treatment, the patient has consented to  Procedure(s): LAPAROSCOPIC CHOLECYSTECTOMY (N/A) as a surgical intervention.  The patient's history has been reviewed, patient examined, no change in status, stable for surgery.  I have reviewed the patient's chart and labs.  Questions were answered to the patient's satisfaction.     Marsha Hillman Rich Brave

## 2022-02-09 NOTE — Anesthesia Preprocedure Evaluation (Addendum)
Anesthesia Evaluation  Patient identified by MRN, date of birth, ID band Patient awake    Reviewed: Allergy & Precautions, H&P , NPO status , Patient's Chart, lab work & pertinent test results  Airway Mallampati: II  TM Distance: >3 FB Neck ROM: Full    Dental no notable dental hx. (+) Edentulous Upper, Dental Advisory Given   Pulmonary neg pulmonary ROS, former smoker,    Pulmonary exam normal breath sounds clear to auscultation       Cardiovascular negative cardio ROS   Rhythm:Regular Rate:Normal     Neuro/Psych negative neurological ROS  negative psych ROS   GI/Hepatic negative GI ROS, Neg liver ROS,   Endo/Other  Hypothyroidism Morbid obesity  Renal/GU negative Renal ROS  negative genitourinary   Musculoskeletal  (+) Arthritis ,   Abdominal   Peds  Hematology  (+) Blood dyscrasia, anemia ,   Anesthesia Other Findings   Reproductive/Obstetrics negative OB ROS                            Anesthesia Physical Anesthesia Plan  ASA: 2  Anesthesia Plan: General   Post-op Pain Management: Tylenol PO (pre-op)*   Induction: Intravenous  PONV Risk Score and Plan: 4 or greater and Ondansetron, Dexamethasone and Midazolam  Airway Management Planned: Oral ETT  Additional Equipment:   Intra-op Plan:   Post-operative Plan: Extubation in OR  Informed Consent: I have reviewed the patients History and Physical, chart, labs and discussed the procedure including the risks, benefits and alternatives for the proposed anesthesia with the patient or authorized representative who has indicated his/her understanding and acceptance.     Dental advisory given  Plan Discussed with: CRNA  Anesthesia Plan Comments:         Anesthesia Quick Evaluation

## 2022-02-09 NOTE — Discharge Instructions (Signed)
LAPAROSCOPIC SURGERY: POST OP INSTRUCTIONS   EAT Gradually transition to a high fiber diet with a fiber supplement over the next few weeks after discharge.  Start with a pureed / full liquid diet (see below)  WALK Walk an hour a day (cumulative- not all at once).  Control your pain to do that.    CONTROL PAIN Control pain so that you can walk, sleep, tolerate sneezing/coughing, go up/down stairs.  HAVE A BOWEL MOVEMENT DAILY Keep your bowels regular to avoid problems.  OK to try a laxative to override constipation.  OK to use an antidairrheal to slow down diarrhea.  Call if not better after 2 tries  CALL IF YOU HAVE PROBLEMS/CONCERNS Call if you are still struggling despite following these instructions. Call if you have concerns not answered by these instructions    DIET: Follow a light bland diet & liquids the first 24 hours after arrival home, such as soup, liquids, starches, etc.  Be sure to drink plenty of fluids.  Quickly advance to a usual solid diet within a few days.  Avoid fast food or heavy meals as your are more likely to get nauseated or have irregular bowels.  A low-sugar, high-fiber diet for the rest of your life is ideal.  Take your usually prescribed home medications unless otherwise directed.  PAIN CONTROL: Pain is best controlled by a usual combination of three different methods TOGETHER: Ice/Heat Over the counter pain medication Prescription pain medication Most patients will experience some swelling and bruising around the incisions.  Ice packs or heating pads (30-60 minutes up to 6 times a day) will help. Use ice for the first few days to help decrease swelling and bruising, then switch to heat to help relax tight/sore spots and speed recovery.  Some people prefer to use ice alone, heat alone, alternating between ice & heat.  Experiment to what works for you.  Swelling and bruising can take several weeks to resolve.   It is helpful to take an over-the-counter pain  medication regularly for the first few days: Naproxen (Aleve, etc)  Two 220mg  tabs twice a day OR Ibuprofen (Advil, etc) Three 200mg  tabs four times a day (every meal & bedtime) AND Acetaminophen (Tylenol, etc) 500-650mg  four times a day (every meal & bedtime) A  prescription for pain medication (such as oxycodone, hydrocodone, tramadol, gabapentin, methocarbamol, etc) should be given to you upon discharge.  Take your pain medication as prescribed, IF NEEDED.  If you are having problems/concerns with the prescription medicine (does not control pain, nausea, vomiting, rash, itching, etc), please call us [phone] to see if we need to switch you to a different pain medicine that will work better for you and/or control your side effect better. If you need a refill on your pain medication, please give [place] 48 hour notice.  contact your pharmacy.  They will contact our office to request authorization. Prescriptions will not be filled after 5 pm or on week-ends  Avoid getting constipated.   Between the surgery and the pain medications, it is common to experience some constipation.   Increasing fluid intake and taking a fiber supplement (such as Metamucil, Citrucel, FiberCon, MiraLax, etc) 1-2 times a day regularly will usually help prevent this problem from occurring.   A mild laxative (prune juice, Milk of Magnesia, MiraLax, etc) should be taken according to package directions if there are no bowel movements after 48 hours.   Watch out for diarrhea.   If you have many loose  bowel movements, simplify your diet to bland foods & liquids for a few days.   Stop any stool softeners and decrease your fiber supplement.   Switching to mild anti-diarrheal medications (Kayopectate, Pepto Bismol) can help.   If this worsens or does not improve, please call us.  Wash / shower every day.  You may shower over the skin glue which is waterproof.  No rubbing, scrubbing, lotions or ointments to incisions.  Do not soak  or submerge incisions.  Glue will flake off after about 2 weeks.  You may leave the incision open to air.  You may replace a dressing/Band-Aid to cover the incision for comfort if you wish.   ACTIVITIES as tolerated:   You may resume regular (light) daily activities beginning the next day--such as daily self-care, walking, climbing stairs--gradually increasing activities as tolerated.  If you can walk 30 minutes without difficulty, it is safe to try more intense activity such as jogging, treadmill, bicycling, low-impact aerobics, swimming, etc. Save the most intensive and strenuous activity for last such as sit-ups, heavy lifting, contact sports, etc  Refrain from any heavy lifting or straining until you are off narcotics for pain control.   DO NOT PUSH THROUGH PAIN.  Let pain be your guide: If it hurts to do something, don't do it.  Pain is your body warning you to avoid that activity for another week until the pain goes down. You may drive when you are no longer taking prescription pain medication, you can comfortably wear a seatbelt, and you can safely maneuver your car and apply brakes. You may have sexual intercourse when it is comfortable.  FOLLOW UP in our office Please call [facility] at [phone] to set up an appointment to see your surgeon in the office for a follow-up appointment approximately 2-3 weeks after your surgery. Make sure that you call for this appointment the day you arrive home to insure a convenient appointment time.  10. IF YOU HAVE DISABILITY OR FAMILY LEAVE FORMS, BRING THEM TO THE OFFICE FOR PROCESSING.  DO NOT GIVE THEM TO YOUR DOCTOR.   WHEN TO CALL us [phone]: Poor pain control Reactions / problems with new medications (rash/itching, nausea, etc)  Fever over 101.5 F (38.5 C) Inability to urinate Nausea and/or vomiting Worsening swelling or bruising Continued bleeding from incision. Increased pain, redness, or drainage from the incision   The clinic  staff is available to answer your questions during regular business hours (8:30am-5pm).  Please don't hesitate to call and ask to speak to one of our nurses for clinical concerns.   If you have a medical emergency, go to the nearest emergency room or call 911.  A surgeon from [facility] is always on call at the [facility], [place], [place], [place], [place]  [address] ? MAIN: [phone] ? TOLL FREE: [phone] ?  FAX [phone] [email or web address]

## 2022-02-09 NOTE — Op Note (Signed)
Operative Note  [name] 56 y.o. female [id number]  02/09/2022  Surgeon: Clovis Riley MD FACS  Procedure performed: Laparoscopic Cholecystectomy  Preop diagnosis: biliary colic Post-op diagnosis/intraop findings: same, chronic cholecystitis, severe hepatosteatosis with hepatomegaly.  Specimens: gallbladder  Retained items: none  EBL: minimal  Complications: none  Description of procedure: After obtaining informed consent the patient was brought to the operating room. Antibiotics were administered. SCD's were applied. General endotracheal anesthesia was initiated and a formal time-out was performed. The abdomen was prepped and draped in the usual sterile fashion and the abdomen was entered using an infraumbilical veress needle after instilling the site with local. Insufflation to 30mHg was obtained, 566mtrocar and camera inserted, and gross inspection revealed no evidence of injury from our entry or other intraabdominal abnormalities. There are thin omental adhesions to the umbilical and lower midline abdominal wall. Two 56m86mrocars were introduced in the right midclavicular and right anterior axillary lines under direct visualization and following infiltration with local. An 51m67mocar was placed in the epigastrium. The liver is noted to be enlarged with appearance consistent with severe steatosis. This did increase the difficulty of the dissection. The gallbladder was retracted cephalad and the infundibulum was retracted laterally. A combination of hook electrocautery and blunt dissection was utilized to clear the peritoneum from the neck and cystic duct, circumferentially isolating the cystic artery and cystic duct and lifting the gallbladder from the cystic plate. The critical view of safety was achieved with the cystic artery, cystic duct, and liver bed visualized between them with no other structures. The artery was clipped with a single clip proximally and distally and  divided as was the cystic duct with three clips on the proximal end. The gallbladder was dissected from the liver plate using electrocautery. Once freed the gallbladder was placed in an endocatch bag and removed through the epigastric trocar site. A minimal amount of bleeding on the liver bed was controlled with cautery. Some bile had been spilled from the gallbladder during its dissection from the liver bed. This was aspirated and the right upper quadrant was irrigated; the effluent was clear. Hemostasis was once again confirmed, and reinspection of the abdomen revealed no injuries. The clips were well opposed without any bile leak from the duct or the liver bed. The 51mm21mcar site in the epigastrium was closed with a 0 vicryl in the fascia under direct visualization using a PMI device. The abdomen was desufflated and all trocars removed. The skin incisions were closed with running subcuticular monocryl and Dermabond. The patient was awakened, extubated and transported to the recovery room in stable condition.    All counts were correct at the completion of the case.

## 2022-02-09 NOTE — Anesthesia Procedure Notes (Signed)
Procedure Name: Intubation Date/Time: 02/09/2022 9:26 AM  Performed by: Sharlette Dense, CRNAPatient Re-evaluated:Patient Re-evaluated prior to induction Oxygen Delivery Method: Circle system utilized Preoxygenation: Pre-oxygenation with 100% oxygen Induction Type: IV induction Laryngoscope Size: Miller and 2 Grade View: Grade I Tube type: Oral Tube size: 7.5 mm Number of attempts: 1 Airway Equipment and Method: Stylet Placement Confirmation: ETT inserted through vocal cords under direct vision, positive ETCO2 and breath sounds checked- equal and bilateral Secured at: 21 cm Tube secured with: Tape Dental Injury: Teeth and Oropharynx as per pre-operative assessment

## 2022-02-09 NOTE — Transfer of Care (Signed)
Immediate Anesthesia Transfer of Care Note  Patient: [name]  Procedure(s) Performed: LAPAROSCOPIC CHOLECYSTECTOMY  Patient Location: PACU  Anesthesia Type:General  Level of Consciousness: drowsy  Airway & Oxygen Therapy: Patient Spontanous Breathing and Patient connected to face mask oxygen  Post-op Assessment: Report given to RN and Post -op Vital signs reviewed and stable  Post vital signs: Reviewed and stable  Last Vitals:  Vitals Value Taken Time  BP 145/105 02/09/22 1030  Temp    Pulse 85 02/09/22 1030  Resp 20 02/09/22 1030  SpO2 100 % 02/09/22 1030  Vitals shown include unvalidated device data.  Last Pain:  Vitals:   02/09/22 0826  TempSrc:   PainSc: 0-No pain         Complications: No notable events documented.

## 2022-02-09 NOTE — Anesthesia Postprocedure Evaluation (Signed)
Anesthesia Post Note  Patient: [name]  Procedure(s) Performed: LAPAROSCOPIC CHOLECYSTECTOMY     Patient location during evaluation: PACU Anesthesia Type: General Level of consciousness: awake and alert Pain management: pain level controlled Vital Signs Assessment: post-procedure vital signs reviewed and stable Respiratory status: spontaneous breathing, nonlabored ventilation and respiratory function stable Cardiovascular status: blood pressure returned to baseline and stable Postop Assessment: no apparent nausea or vomiting Anesthetic complications: no   No notable events documented.  Last Vitals:  Vitals:   02/09/22 1115 02/09/22 1130  BP: (!) 134/92 (!) 132/90  Pulse: 84 78  Resp: (!) 22 19  Temp:  36.4 C  SpO2: 96% 96%    Last Pain:  Vitals:   02/09/22 1130  TempSrc:   PainSc: 0-No pain                  Keltner,W. EDMOND

## 2022-02-10 ENCOUNTER — Encounter (HOSPITAL_COMMUNITY): Payer: Self-pay | Admitting: Surgery

## 2022-02-10 LAB — SURGICAL PATHOLOGY

## 2022-06-17 ENCOUNTER — Ambulatory Visit
Admission: EM | Admit: 2022-06-17 | Discharge: 2022-06-17 | Disposition: A | Discharge status: Other Acute Inpatient Hospital | Payer: 59

## 2022-06-17 DIAGNOSIS — R109 Unspecified abdominal pain: Secondary | ICD-10-CM

## 2022-06-17 NOTE — Discharge Instructions (Signed)
  Please report to [facility] for further evaluation.   [place].  [place], [place] [address]
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
## 2022-06-18 ENCOUNTER — Emergency Department (HOSPITAL_BASED_OUTPATIENT_CLINIC_OR_DEPARTMENT_OTHER): Payer: 59

## 2022-06-18 ENCOUNTER — Other Ambulatory Visit: Payer: Self-pay

## 2022-06-18 ENCOUNTER — Emergency Department (HOSPITAL_BASED_OUTPATIENT_CLINIC_OR_DEPARTMENT_OTHER)
Admission: EM | Admit: 2022-06-18 | Discharge: 2022-06-18 | Disposition: A | Discharge status: Home / Self Care | Payer: 59 | Attending: Emergency Medicine | Admitting: Emergency Medicine

## 2022-06-18 DIAGNOSIS — K760 Fatty (change of) liver, not elsewhere classified: Secondary | ICD-10-CM | POA: Diagnosis not present

## 2022-06-18 DIAGNOSIS — Y92410 Unspecified street and highway as the place of occurrence of the external cause: Secondary | ICD-10-CM | POA: Diagnosis not present

## 2022-06-18 DIAGNOSIS — S161XXA Strain of muscle, fascia and tendon at neck level, initial encounter: Secondary | ICD-10-CM | POA: Insufficient documentation

## 2022-06-18 DIAGNOSIS — Y990 Civilian activity done for income or pay: Secondary | ICD-10-CM | POA: Insufficient documentation

## 2022-06-18 DIAGNOSIS — Z79899 Other long term (current) drug therapy: Secondary | ICD-10-CM | POA: Insufficient documentation

## 2022-06-18 DIAGNOSIS — R0789 Other chest pain: Secondary | ICD-10-CM | POA: Insufficient documentation

## 2022-06-18 DIAGNOSIS — S199XXA Unspecified injury of neck, initial encounter: Secondary | ICD-10-CM | POA: Diagnosis present

## 2022-06-18 DIAGNOSIS — E039 Hypothyroidism, unspecified: Secondary | ICD-10-CM | POA: Insufficient documentation

## 2022-06-18 LAB — CBC WITH DIFFERENTIAL/PLATELET
Abs Immature Granulocytes: 0.02 10*3/uL (ref 0.00–0.07)
Basophils Absolute: 0 10*3/uL (ref 0.0–0.1)
Basophils Relative: 0 %
Eosinophils Absolute: 0.2 10*3/uL (ref 0.0–0.5)
Eosinophils Relative: 4 %
HCT: 41.2 % (ref 36.0–46.0)
Hemoglobin: 13.4 g/dL (ref 12.0–15.0)
Immature Granulocytes: 0 %
Lymphocytes Relative: 36 %
Lymphs Abs: 2.2 10*3/uL (ref 0.7–4.0)
MCH: 29.1 pg (ref 26.0–34.0)
MCHC: 32.5 g/dL (ref 30.0–36.0)
MCV: 89.6 fL (ref 80.0–100.0)
Monocytes Absolute: 0.4 10*3/uL (ref 0.1–1.0)
Monocytes Relative: 6 %
Neutro Abs: 3.3 10*3/uL (ref 1.7–7.7)
Neutrophils Relative %: 54 %
Platelets: 308 10*3/uL (ref 150–400)
RBC: 4.6 MIL/uL (ref 3.87–5.11)
RDW: 12.5 % (ref 11.5–15.5)
WBC: 6.1 10*3/uL (ref 4.0–10.5)
nRBC: 0 % (ref 0.0–0.2)

## 2022-06-18 LAB — COMPREHENSIVE METABOLIC PANEL
ALT: 19 U/L (ref 0–44)
AST: 36 U/L (ref 15–41)
Albumin: 4.1 g/dL (ref 3.5–5.0)
Alkaline Phosphatase: 73 U/L (ref 38–126)
Anion gap: 10 (ref 5–15)
BUN: 6 mg/dL (ref 6–20)
CO2: 28 mmol/L (ref 22–32)
Calcium: 9.3 mg/dL (ref 8.9–10.3)
Chloride: 97 mmol/L — ABNORMAL LOW (ref 98–111)
Creatinine, Ser: 0.85 mg/dL (ref 0.44–1.00)
GFR, Estimated: >60 mL/min (ref >60)
Glucose, Bld: 364 mg/dL — ABNORMAL HIGH (ref 70–99)
Potassium: 4 mmol/L (ref 3.5–5.1)
Sodium: 135 mmol/L (ref 135–145)
Total Bilirubin: 0.6 mg/dL (ref 0.3–1.2)
Total Protein: 7.9 g/dL (ref 6.5–8.1)

## 2022-06-18 LAB — LIPASE, BLOOD: Lipase: 19 U/L (ref 11–51)

## 2022-06-18 MED ORDER — LIDOCAINE 5 % EX PTCH: 1.0000 | MEDICATED_PATCH | CUTANEOUS | 0 refills | Status: AC

## 2022-06-18 MED ORDER — CYCLOBENZAPRINE HCL 10 MG PO TABS: 5.0000 mg | ORAL_TABLET | Freq: Three times a day (TID) | ORAL | 0 refills | Status: AC | PRN

## 2022-06-18 MED ORDER — CYCLOBENZAPRINE HCL 5 MG PO TABS
5.0000 mg | ORAL_TABLET | Freq: Once | ORAL | Status: AC
  Administered 2022-06-18: 5 mg via ORAL
  Filled 2022-06-18: qty 1

## 2022-06-18 MED ORDER — IOHEXOL 350 MG/ML SOLN: 80.0000 mL | Freq: Once | INTRAVENOUS | Status: DC | PRN

## 2022-06-18 MED ORDER — LIDOCAINE 5 % EX PTCH
2.0000 | MEDICATED_PATCH | CUTANEOUS | Status: DC
  Administered 2022-06-18: 2 via TRANSDERMAL
  Filled 2022-06-18: qty 2

## 2022-06-18 MED ORDER — IOHEXOL 300 MG/ML  SOLN
80.0000 mL | Freq: Once | INTRAMUSCULAR | Status: AC | PRN
  Administered 2022-06-18: 80 mL via INTRAVENOUS

## 2022-06-18 MED ORDER — KETOROLAC TROMETHAMINE 15 MG/ML IJ SOLN
15.0000 mg | Freq: Once | INTRAMUSCULAR | Status: AC
  Administered 2022-06-18: 15 mg via INTRAVENOUS
  Filled 2022-06-18: qty 1
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
## 2022-09-23 ENCOUNTER — Other Ambulatory Visit: Payer: Self-pay | Admitting: Family Medicine

## 2022-09-23 DIAGNOSIS — Z1231 Encounter for screening mammogram for malignant neoplasm of breast: Secondary | ICD-10-CM

## 2022-11-08 ENCOUNTER — Ambulatory Visit
Admission: RE | Admit: 2022-11-08 | Discharge: 2022-11-08 | Disposition: A | Discharge status: Home / Self Care | Payer: 59 | Source: Ambulatory Visit | Attending: Family Medicine | Admitting: Family Medicine

## 2022-11-08 DIAGNOSIS — Z1231 Encounter for screening mammogram for malignant neoplasm of breast: Secondary | ICD-10-CM

## 2022-11-10 ENCOUNTER — Other Ambulatory Visit: Payer: Self-pay | Admitting: Family Medicine

## 2022-11-10 DIAGNOSIS — R928 Other abnormal and inconclusive findings on diagnostic imaging of breast: Secondary | ICD-10-CM

## 2022-11-23 ENCOUNTER — Other Ambulatory Visit: Payer: Self-pay | Admitting: Family Medicine

## 2022-11-23 ENCOUNTER — Ambulatory Visit
Admission: RE | Admit: 2022-11-23 | Discharge: 2022-11-23 | Disposition: A | Discharge status: Home / Self Care | Payer: 59 | Source: Ambulatory Visit | Attending: Family Medicine | Admitting: Family Medicine

## 2022-11-23 DIAGNOSIS — R928 Other abnormal and inconclusive findings on diagnostic imaging of breast: Secondary | ICD-10-CM

## 2022-11-23 DIAGNOSIS — R599 Enlarged lymph nodes, unspecified: Secondary | ICD-10-CM

## 2022-11-23 DIAGNOSIS — N6489 Other specified disorders of breast: Secondary | ICD-10-CM

## 2022-11-29 ENCOUNTER — Ambulatory Visit
Admission: RE | Admit: 2022-11-29 | Discharge: 2022-11-29 | Disposition: A | Discharge status: Home / Self Care | Payer: 59 | Source: Ambulatory Visit | Attending: Family Medicine | Admitting: Family Medicine

## 2022-11-29 DIAGNOSIS — N6489 Other specified disorders of breast: Secondary | ICD-10-CM

## 2022-11-29 DIAGNOSIS — R599 Enlarged lymph nodes, unspecified: Secondary | ICD-10-CM

## 2022-11-29 DIAGNOSIS — R928 Other abnormal and inconclusive findings on diagnostic imaging of breast: Secondary | ICD-10-CM

## 2022-11-29 HISTORY — PX: BREAST BIOPSY: SHX20

## 2023-10-23 ENCOUNTER — Encounter: Payer: Self-pay | Admitting: Family Medicine

## 2023-10-26 ENCOUNTER — Other Ambulatory Visit: Payer: Self-pay | Admitting: Family Medicine

## 2023-10-26 DIAGNOSIS — Z1231 Encounter for screening mammogram for malignant neoplasm of breast: Secondary | ICD-10-CM

## 2023-11-02 ENCOUNTER — Encounter (HOSPITAL_COMMUNITY): Payer: Self-pay

## 2023-11-09 ENCOUNTER — Ambulatory Visit

## 2023-12-05 ENCOUNTER — Ambulatory Visit
Admission: RE | Admit: 2023-12-05 | Discharge: 2023-12-05 | Disposition: A | Discharge status: Home / Self Care | Payer: Self-pay | Source: Ambulatory Visit | Attending: Family Medicine | Admitting: Family Medicine

## 2023-12-05 ENCOUNTER — Ambulatory Visit

## 2023-12-05 DIAGNOSIS — Z1231 Encounter for screening mammogram for malignant neoplasm of breast: Secondary | ICD-10-CM

## 2023-12-08 ENCOUNTER — Ambulatory Visit
